# Patient Record
Sex: Male | Born: 1945 | Race: White | Hispanic: No | Marital: Married | Smoking: Never smoker
Health system: Southern US, Community
[De-identification: ages and names within clinical notes are randomized; demographics above are authoritative.]

## PROBLEM LIST (undated history)

## (undated) DIAGNOSIS — Z952 Presence of prosthetic heart valve: Secondary | ICD-10-CM

## (undated) DIAGNOSIS — E785 Hyperlipidemia, unspecified: Secondary | ICD-10-CM

## (undated) DIAGNOSIS — I739 Peripheral vascular disease, unspecified: Secondary | ICD-10-CM

## (undated) DIAGNOSIS — I10 Essential (primary) hypertension: Secondary | ICD-10-CM

## (undated) DIAGNOSIS — L989 Disorder of the skin and subcutaneous tissue, unspecified: Secondary | ICD-10-CM

## (undated) DIAGNOSIS — C4490 Unspecified malignant neoplasm of skin, unspecified: Secondary | ICD-10-CM

## (undated) DIAGNOSIS — I712 Thoracic aortic aneurysm, without rupture, unspecified: Secondary | ICD-10-CM

## (undated) DIAGNOSIS — I350 Nonrheumatic aortic (valve) stenosis: Secondary | ICD-10-CM

## (undated) DIAGNOSIS — I359 Nonrheumatic aortic valve disorder, unspecified: Secondary | ICD-10-CM

## (undated) DIAGNOSIS — R0789 Other chest pain: Secondary | ICD-10-CM

## (undated) DIAGNOSIS — I77810 Thoracic aortic ectasia: Secondary | ICD-10-CM

## (undated) HISTORY — DX: Essential (primary) hypertension: I10

## (undated) HISTORY — DX: Thoracic aortic aneurysm, without rupture: I71.2

## (undated) HISTORY — DX: Disorder of the skin and subcutaneous tissue, unspecified: L98.9

## (undated) HISTORY — DX: Unspecified malignant neoplasm of skin, unspecified: C44.90

## (undated) HISTORY — DX: Hyperlipidemia, unspecified: E78.5

## (undated) HISTORY — DX: Nonrheumatic aortic valve disorder, unspecified: I35.9

## (undated) HISTORY — DX: Peripheral vascular disease, unspecified: I73.9

## (undated) HISTORY — DX: Other chest pain: R07.89

## (undated) HISTORY — DX: Thoracic aortic aneurysm, without rupture, unspecified: I71.20

---

## 2001-02-18 HISTORY — PX: MOUTH SURGERY: SHX715

## 2002-03-23 ENCOUNTER — Encounter: Payer: Self-pay | Admitting: Sports Medicine

## 2002-03-23 ENCOUNTER — Encounter: Admission: RE | Admit: 2002-03-23 | Discharge: 2002-03-23 | Payer: Self-pay | Admitting: Sports Medicine

## 2002-04-22 ENCOUNTER — Ambulatory Visit (HOSPITAL_BASED_OUTPATIENT_CLINIC_OR_DEPARTMENT_OTHER): Admission: RE | Admit: 2002-04-22 | Discharge: 2002-04-22 | Payer: Self-pay | Admitting: Orthopedic Surgery

## 2003-02-19 HISTORY — PX: TENDON REPAIR: SHX5111

## 2003-09-28 ENCOUNTER — Encounter: Admission: RE | Admit: 2003-09-28 | Discharge: 2003-09-28 | Payer: Self-pay | Admitting: Family Medicine

## 2005-12-05 HISTORY — PX: CARDIAC CATHETERIZATION: SHX172

## 2005-12-12 ENCOUNTER — Ambulatory Visit (HOSPITAL_COMMUNITY): Admission: RE | Admit: 2005-12-12 | Discharge: 2005-12-12 | Payer: Self-pay | Admitting: Cardiovascular Disease

## 2006-01-02 ENCOUNTER — Encounter: Admission: RE | Admit: 2006-01-02 | Discharge: 2006-01-02 | Payer: Self-pay | Admitting: Orthopedic Surgery

## 2006-11-05 ENCOUNTER — Ambulatory Visit (HOSPITAL_COMMUNITY): Admission: RE | Admit: 2006-11-05 | Discharge: 2006-11-05 | Payer: Self-pay | Admitting: Cardiovascular Disease

## 2008-01-01 ENCOUNTER — Ambulatory Visit (HOSPITAL_COMMUNITY): Admission: RE | Admit: 2008-01-01 | Discharge: 2008-01-01 | Payer: Self-pay | Admitting: Cardiovascular Disease

## 2008-12-26 ENCOUNTER — Ambulatory Visit (HOSPITAL_COMMUNITY): Admission: RE | Admit: 2008-12-26 | Discharge: 2008-12-26 | Payer: Self-pay | Admitting: Cardiovascular Disease

## 2010-07-06 NOTE — Op Note (Signed)
NAME:  Sean Abbott, Sean Abbott NO.:  [id number]   MEDICAL RECORD NO.:  [id number]                   PATIENT TYPE:  AMB   LOCATION:  DSC                                  FACILITY:  [facility]   PHYSICIAN:  Loreta Ave, M.D.              DATE OF BIRTH:  May 24, 1945   DATE OF PROCEDURE:  04/22/2002  DATE OF DISCHARGE:                                 OPERATIVE REPORT   PREOPERATIVE DIAGNOSIS:  Chronic lateral epicondylitis, left elbow, with  tearing extensor carpi radialis brevis tendon from epicondyle.   POSTOPERATIVE DIAGNOSIS:  Chronic lateral epicondylitis, left elbow, with  tearing extensor carpi radialis brevis tendon from epicondyle.   OPERATIVE PROCEDURE:  Left elbow exploration with debridement of extensor  carpi radialis brevis tendon and removal of inflammatory debris including  arthrotomy and partial synovectomy.  Drilling epicondyle.  Repair extensor  carpi radialis brevis to superficial extensors.   SURGEON:  Loreta Ave, M.D.   ASSISTANT:  Arlys John D. Petrarca, P.A.-C.   ANESTHESIA:  General.   BLOOD LOSS:  Minimal.   SPECIMENS:  None.   CULTURES:  None.   DRESSINGS:  Soft, compressive with a sugar-tong splint.   TOURNIQUET TIME:  40 minutes.   PROCEDURE IN DETAIL:  The patient was brought to the operating room and  placed on the operating table in the supine position.  After adequate  anesthesia had been obtained, the left elbow was examined.  There was full  motion and good stability.  Tourniquet applied.  Prepped and draped in the  usual sterile fashion.  Exsanguinated with elevation and Esmarch.  Tourniquet inflated to 250 mmHg.  Longitudinal incision from the lateral  epicondyle extending distally.  Skin and subcutaneous tissue divided.  Superficial extensors intact and had good continuity up to the epicondyle.  These were divided longitudinally, exposing the ECRB tendon.  Marked  mucinous degeneration and tearing off the  epicondyle with degeneration of  the tendon all the way down to the radial head.  All inflammatory debris,  lateral elbow capsule which was adherent, torn and inflamed, as well as  underlying synovial tissue debrided.  Thorough irrigation.  Care taken to  remove all inflammatory and degenerative debris.  I still had good  longitudinal continuity of the superficial extensors.  Epicondyle debrided,  treated with multiple drilling, and sharp bony spurs removed.  Wound  irrigated.  The superficial extensors were then reapproximated over the  epicondyle, incorporating the ECRB tendon into this with a longitudinal  closure with Vicryl.  Wound irrigated.  Skin  and subcutaneous tissue closed with Vicryl.  Margins were injected with  Marcaine.  Steri-Strips applied.  Sterile compressive dressing and sugar-  tong splint applied.  Tourniquet deflated and removed.  Sling applied.  Anesthesia burst.  Brought to recovery room.  Tolerated procedure without  any complications.  Loreta Ave, M.D.    DFM/MEDQ  D:  04/22/2002  T:  04/23/2002  Job:  [id number]

## 2010-07-20 ENCOUNTER — Other Ambulatory Visit: Payer: Self-pay | Admitting: Cardiovascular Disease

## 2010-07-20 DIAGNOSIS — I712 Thoracic aortic aneurysm, without rupture: Secondary | ICD-10-CM

## 2010-07-25 ENCOUNTER — Ambulatory Visit
Admission: RE | Admit: 2010-07-25 | Discharge: 2010-07-25 | Disposition: A | Discharge status: Home / Self Care | Payer: BC Managed Care – PPO | Source: Ambulatory Visit | Attending: Cardiovascular Disease | Admitting: Cardiovascular Disease

## 2010-07-25 DIAGNOSIS — I712 Thoracic aortic aneurysm, without rupture: Secondary | ICD-10-CM

## 2010-07-25 MED ORDER — IOHEXOL 300 MG/ML  SOLN
100.0000 mL | Freq: Once | INTRAMUSCULAR | Status: AC | PRN
  Administered 2010-07-25: 100 mL via INTRAVENOUS

## 2011-10-31 ENCOUNTER — Other Ambulatory Visit: Payer: Self-pay | Admitting: Cardiovascular Disease

## 2011-10-31 DIAGNOSIS — I712 Thoracic aortic aneurysm, without rupture: Secondary | ICD-10-CM

## 2011-11-22 ENCOUNTER — Ambulatory Visit
Admission: RE | Admit: 2011-11-22 | Discharge: 2011-11-22 | Disposition: A | Discharge status: Home / Self Care | Payer: Medicare Other | Source: Ambulatory Visit | Attending: Cardiovascular Disease | Admitting: Cardiovascular Disease

## 2011-11-22 DIAGNOSIS — I712 Thoracic aortic aneurysm, without rupture: Secondary | ICD-10-CM

## 2011-11-22 MED ORDER — IOHEXOL 350 MG/ML SOLN
100.0000 mL | Freq: Once | INTRAVENOUS | Status: AC | PRN
  Administered 2011-11-22: 100 mL via INTRAVENOUS

## 2012-08-24 ENCOUNTER — Encounter: Payer: Self-pay | Admitting: Cardiovascular Disease

## 2012-08-26 ENCOUNTER — Ambulatory Visit: Payer: Medicare Other | Admitting: Cardiovascular Disease

## 2012-09-10 ENCOUNTER — Encounter: Payer: Self-pay | Admitting: Cardiovascular Disease

## 2012-09-10 ENCOUNTER — Ambulatory Visit (INDEPENDENT_AMBULATORY_CARE_PROVIDER_SITE_OTHER): Payer: Medicare Other | Admitting: Cardiovascular Disease

## 2012-09-10 VITALS — BP 120/84 | HR 63 | Ht 70.0 in | Wt 189.0 lb

## 2012-09-10 DIAGNOSIS — I359 Nonrheumatic aortic valve disorder, unspecified: Secondary | ICD-10-CM

## 2012-09-10 DIAGNOSIS — I712 Thoracic aortic aneurysm, without rupture, unspecified: Secondary | ICD-10-CM | POA: Insufficient documentation

## 2012-09-10 DIAGNOSIS — R0989 Other specified symptoms and signs involving the circulatory and respiratory systems: Secondary | ICD-10-CM

## 2012-09-10 DIAGNOSIS — IMO0001 Reserved for inherently not codable concepts without codable children: Secondary | ICD-10-CM

## 2012-09-10 DIAGNOSIS — E785 Hyperlipidemia, unspecified: Secondary | ICD-10-CM | POA: Insufficient documentation

## 2012-09-10 DIAGNOSIS — Z79899 Other long term (current) drug therapy: Secondary | ICD-10-CM

## 2012-09-10 DIAGNOSIS — E782 Mixed hyperlipidemia: Secondary | ICD-10-CM

## 2012-09-10 DIAGNOSIS — I350 Nonrheumatic aortic (valve) stenosis: Secondary | ICD-10-CM

## 2012-09-10 NOTE — Assessment & Plan Note (Signed)
Last echo 12/05/11 revealing mild concentric LVH, normal LV function at a valve area of 1.1 cm, peak gradient of 31 and mean of 19. We'll recheck this October

## 2012-09-10 NOTE — Patient Instructions (Addendum)
Your physician has requested that you have an echocardiogram in October 2014. Echocardiography is a painless test that uses sound waves to create images of your heart. It provides your doctor with information about the size and shape of your heart and how well your heart's chambers and valves are working. This procedure takes approximately one hour. There are no restrictions for this procedure.  Non-Cardiac CT Angiography (CTA), is a special type of CT scan that uses a computer to produce multi-dimensional views of major blood vessels throughout the body. In CT angiography, a contrast material is injected through an IV to help visualize the blood vessels. Our scheduling department will call you with the appointment.  Your physician has requested that you have a carotid duplex. This test is an ultrasound of the carotid arteries in your neck. It looks at blood flow through these arteries that supply the brain with blood. Allow one hour for this exam. There are no restrictions or special instructions.  Your physician recommends that you return for lab work in: 3 months.    Your physician recommends that you schedule a follow-up appointment in: One Year

## 2012-09-10 NOTE — Assessment & Plan Note (Signed)
Last measured by CT angiography October 2013-5.1 cm. We will repeat check this coming October

## 2012-09-10 NOTE — Progress Notes (Signed)
09/10/2012 Sean Abbott   [birth year]  [id number]  Primary Physician No primary provider on file. Primary Cardiologist: Runell Gess MD Sean Abbott   HPI:  The patient is a delightful 67 year old, thin and fit-appearing, married Caucasian male, father of 2, grandfather to 3 grandchildren who I saw a year ago. He is accompanied by his wife today. He has a history of normal coronary arteries by cath which I performed December 05, 2005. He did have a generous thoracic aorta at that time and a CT showed his thoracic aorta measuring 4 cm as well as mild to moderate aortic stenosis. His other problems include mild hyperlipidemia in Crestor. He denies chest pain or shortness of breath but does complain of just some generalized fatigue, though he does exercise several times a week. 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 His last echo performed November 23, 2010, revealed normal LV systolic function, mild concentric LVH with a valve area of 1.1 cm squared, peak gradient of 29 and mean of 17. Since I saw him last 08/01/11 he's been completely asymptomatic.      Current Outpatient Prescriptions  Medication Sig Dispense Refill  . aspirin EC 81 MG tablet Take 81 mg by mouth daily.      . Glucosamine-Chondroit-Vit C-Mn (GLUCOSAMINE-CHONDROITIN) CAPS Take 1 capsule by mouth daily.      [name] KRILL OIL PO Take 1 capsule by mouth daily.      . Multiple Vitamin (MULTIVITAMIN) tablet Take 1 tablet by mouth daily.      . rosuvastatin (CRESTOR) 10 MG tablet Take 10 mg by mouth every other day.        No current facility-administered medications for this visit.    Allergies  Allergen Reactions  . Demerol (Meperidine)   . Lipitor (Atorvastatin) Other (See Comments)    Myalgias    History   Social History  . Marital Status: Married    Spouse Name: N/A     Number of Children: N/A  . Years of Education: N/A   Occupational History  . Not on file.   Social History Main Topics  . Smoking status: Never Smoker   . Smokeless tobacco: Not on file  . Alcohol Use: Not on file  . Drug Use: Not on file  . Sexually Active: Not on file   Other Topics Concern  . Not on file   Social History Narrative  . No narrative on file     Review of Systems: General: negative for chills, fever, night sweats or weight changes.  Cardiovascular: negative for chest pain, dyspnea on exertion, edema, orthopnea, palpitations, paroxysmal nocturnal dyspnea or shortness of breath Dermatological: negative for rash Respiratory: negative for cough or wheezing Urologic: negative for hematuria Abdominal: negative for nausea, vomiting, diarrhea, bright red blood per rectum, melena, or hematemesis Neurologic: negative for visual changes, syncope, or dizziness All other systems reviewed and are otherwise negative except as noted above.    Blood pressure 120/84, pulse 63, height 5\' 10"  (1.778 m), weight 189 lb (85.73 kg).  General appearance: alert and no distress Neck: no adenopathy, no JVD, supple, symmetrical, trachea midline, thyroid not enlarged, symmetric, no tenderness/mass/nodules and left carotid bruit Lungs: clear to auscultation bilaterally Heart: 2/6 outflow tract murmur consistent with aortic stenosis Extremities: extremities normal, atraumatic, no cyanosis or edema  EKG normal sinus rhythm at 63 without ST  or T wave changes  ASSESSMENT AND PLAN:   Thoracic aortic aneurysm Last measured by CT angiography October 2013-5.1 cm. We will repeat check this coming October  Aortic stenosis Last echo 12/05/11 revealing mild concentric LVH, normal LV function at a valve area of 1.1 cm, peak gradient of 31 and mean of 19. We'll recheck this October      Runell Gess MD [facility] 09/10/2012 5:30 PM

## 2012-09-16 ENCOUNTER — Other Ambulatory Visit (HOSPITAL_COMMUNITY): Payer: Medicare Other

## 2012-10-28 ENCOUNTER — Other Ambulatory Visit: Payer: Self-pay | Admitting: *Deleted

## 2012-10-28 DIAGNOSIS — Z79899 Other long term (current) drug therapy: Secondary | ICD-10-CM

## 2012-10-28 DIAGNOSIS — I712 Thoracic aortic aneurysm, without rupture: Secondary | ICD-10-CM

## 2012-11-03 ENCOUNTER — Other Ambulatory Visit: Payer: Self-pay | Admitting: *Deleted

## 2012-11-03 DIAGNOSIS — Z79899 Other long term (current) drug therapy: Secondary | ICD-10-CM

## 2012-11-23 ENCOUNTER — Ambulatory Visit (HOSPITAL_COMMUNITY)
Admission: RE | Admit: 2012-11-23 | Discharge: 2012-11-23 | Disposition: A | Discharge status: Home / Self Care | Payer: Medicare Other | Source: Ambulatory Visit | Attending: Cardiovascular Disease | Admitting: Cardiovascular Disease

## 2012-11-23 DIAGNOSIS — I350 Nonrheumatic aortic (valve) stenosis: Secondary | ICD-10-CM

## 2012-11-23 DIAGNOSIS — I712 Thoracic aortic aneurysm, without rupture, unspecified: Secondary | ICD-10-CM | POA: Insufficient documentation

## 2012-11-23 DIAGNOSIS — I359 Nonrheumatic aortic valve disorder, unspecified: Secondary | ICD-10-CM

## 2012-11-23 NOTE — Progress Notes (Signed)
2D Echo Performed 11/23/2012    Phiona Ramnauth, RCS  

## 2012-11-24 ENCOUNTER — Encounter (HOSPITAL_COMMUNITY): Payer: Self-pay

## 2012-11-24 ENCOUNTER — Ambulatory Visit (HOSPITAL_COMMUNITY)
Admission: RE | Admit: 2012-11-24 | Discharge: 2012-11-24 | Disposition: A | Discharge status: Home / Self Care | Payer: Medicare Other | Source: Ambulatory Visit | Attending: Cardiovascular Disease | Admitting: Cardiovascular Disease

## 2012-11-24 DIAGNOSIS — I712 Thoracic aortic aneurysm, without rupture: Secondary | ICD-10-CM

## 2012-11-24 MED ORDER — IOHEXOL 350 MG/ML SOLN
80.0000 mL | Freq: Once | INTRAVENOUS | Status: AC | PRN
  Administered 2012-11-24: 80 mL via INTRAVENOUS

## 2012-11-25 ENCOUNTER — Ambulatory Visit (HOSPITAL_COMMUNITY)
Admission: RE | Admit: 2012-11-25 | Discharge: 2012-11-25 | Disposition: A | Discharge status: Home / Self Care | Payer: Medicare Other | Source: Ambulatory Visit | Attending: Cardiovascular Disease | Admitting: Cardiovascular Disease

## 2012-11-25 DIAGNOSIS — R0989 Other specified symptoms and signs involving the circulatory and respiratory systems: Secondary | ICD-10-CM

## 2012-11-25 LAB — BASIC METABOLIC PANEL
Calcium: 9.1 mg/dL (ref 8.4–10.5)
Creat: 1.06 mg/dL (ref 0.50–1.35)

## 2012-11-25 NOTE — Progress Notes (Signed)
Carotid Duplex Completed. Sean Abbott, BS, RDMS, RVT  

## 2012-11-26 ENCOUNTER — Encounter: Payer: Self-pay | Admitting: *Deleted

## 2012-11-26 NOTE — Progress Notes (Signed)
Quick Note:  Note sent to patient ______ 

## 2012-11-27 NOTE — Progress Notes (Signed)
Quick Note:  Note sent to patient ______ 

## 2013-05-10 ENCOUNTER — Telehealth: Payer: Self-pay | Admitting: Cardiovascular Disease

## 2013-05-10 ENCOUNTER — Encounter (INDEPENDENT_AMBULATORY_CARE_PROVIDER_SITE_OTHER): Payer: Self-pay

## 2013-05-10 NOTE — Telephone Encounter (Signed)
Returned call and pt verified x 2.  Pt stated he had some tests run last fall and remembers getting some information about logging in to check them, but lost the papers.  Would like the information again and for it to be mailed to his [place] address.  RN also had pt verify the last 4 digits of his SS number.  Pt informed will mail information.  Pt verbalized understanding and agreed w/ plan.  Address:  [address] [place] [address]  [place] letter printed and mailed.

## 2013-05-10 NOTE — Telephone Encounter (Signed)
Please call,wants to know how to use his my-chart please.

## 2013-05-11 ENCOUNTER — Telehealth: Payer: Self-pay | Admitting: Cardiovascular Disease

## 2013-05-11 MED ORDER — ROSUVASTATIN CALCIUM 10 MG PO TABS: 10.0000 mg | ORAL_TABLET | ORAL | Status: DC

## 2013-05-11 NOTE — Telephone Encounter (Signed)
Refills sent to pharmacy. 

## 2013-05-11 NOTE — Telephone Encounter (Signed)
Need refill on Crestor 10 mg #90

## 2013-05-12 ENCOUNTER — Other Ambulatory Visit: Payer: Self-pay | Admitting: *Deleted

## 2013-05-14 ENCOUNTER — Other Ambulatory Visit: Payer: Self-pay | Admitting: *Deleted

## 2013-10-12 ENCOUNTER — Telehealth: Payer: Self-pay | Admitting: Cardiovascular Disease

## 2013-10-12 DIAGNOSIS — I350 Nonrheumatic aortic (valve) stenosis: Secondary | ICD-10-CM

## 2013-10-12 NOTE — Telephone Encounter (Signed)
Mr. Alcorta is wanting to know should he have any test done before scheduling an appt with Dr. Gwenlyn Found. He is suppose to come back in year . Last office visit was 08/2012.

## 2013-10-12 NOTE — Telephone Encounter (Signed)
He needs an echo.  I have placed the order

## 2013-10-19 ENCOUNTER — Ambulatory Visit (HOSPITAL_COMMUNITY)
Admission: RE | Admit: 2013-10-19 | Discharge: 2013-10-19 | Disposition: A | Discharge status: Home / Self Care | Payer: Medicare Other | Source: Ambulatory Visit | Attending: Cardiology | Admitting: Cardiology

## 2013-10-19 DIAGNOSIS — I712 Thoracic aortic aneurysm, without rupture, unspecified: Secondary | ICD-10-CM | POA: Diagnosis not present

## 2013-10-19 DIAGNOSIS — I079 Rheumatic tricuspid valve disease, unspecified: Secondary | ICD-10-CM | POA: Insufficient documentation

## 2013-10-19 DIAGNOSIS — I350 Nonrheumatic aortic (valve) stenosis: Secondary | ICD-10-CM

## 2013-10-19 DIAGNOSIS — I359 Nonrheumatic aortic valve disorder, unspecified: Secondary | ICD-10-CM | POA: Diagnosis not present

## 2013-10-19 DIAGNOSIS — E785 Hyperlipidemia, unspecified: Secondary | ICD-10-CM | POA: Insufficient documentation

## 2013-10-19 NOTE — Progress Notes (Signed)
2D Echocardiogram Complete.  10/19/2013   Sean Abbott, [place]

## 2013-10-21 ENCOUNTER — Telehealth: Payer: Self-pay | Admitting: *Deleted

## 2013-10-21 DIAGNOSIS — I350 Nonrheumatic aortic (valve) stenosis: Secondary | ICD-10-CM

## 2013-10-21 NOTE — Telephone Encounter (Signed)
Message copied by Chauncy Lean on Thu Oct 21, 2013  2:59 PM ------      Message from: Lorretta Harp      Created: Tue Oct 19, 2013  7:17 PM       Nl LV fxn. Mod AS. No change. Repeat 12 months ------

## 2013-10-21 NOTE — Telephone Encounter (Signed)
Order placed for repeat echocardiogram in 1 year 

## 2014-01-14 ENCOUNTER — Other Ambulatory Visit: Payer: Self-pay | Admitting: Cardiovascular Disease

## 2018-06-05 ENCOUNTER — Ambulatory Visit: Payer: Medicare Other | Admitting: Cardiovascular Disease

## 2018-07-06 ENCOUNTER — Telehealth: Payer: Self-pay | Admitting: Cardiovascular Disease

## 2018-07-07 ENCOUNTER — Other Ambulatory Visit: Payer: Self-pay

## 2018-07-07 ENCOUNTER — Telehealth (INDEPENDENT_AMBULATORY_CARE_PROVIDER_SITE_OTHER): Payer: Medicare Other | Admitting: Cardiovascular Disease

## 2018-07-07 ENCOUNTER — Telehealth: Payer: Self-pay

## 2018-07-07 VITALS — BP 116/76 | HR 64 | Ht 70.0 in | Wt 187.0 lb

## 2018-07-07 DIAGNOSIS — Z01812 Encounter for preprocedural laboratory examination: Secondary | ICD-10-CM

## 2018-07-07 DIAGNOSIS — I712 Thoracic aortic aneurysm, without rupture, unspecified: Secondary | ICD-10-CM

## 2018-07-07 DIAGNOSIS — I350 Nonrheumatic aortic (valve) stenosis: Secondary | ICD-10-CM | POA: Diagnosis not present

## 2018-07-07 DIAGNOSIS — E782 Mixed hyperlipidemia: Secondary | ICD-10-CM

## 2018-07-07 NOTE — Patient Instructions (Signed)
    [place] [address] Dept: [phone] Loc: [place]  07/07/2018  You are scheduled for a Cardiac Catheterization on Monday, June 1 with Dr. Quay Burow.  1. Please arrive at the [facility] (Main Entrance A) at [facility]: [address] [place], [place] [address] at 5:30 AM (This time is two hours before your procedure to ensure your preparation). Free valet parking service is available.   Special note: Every effort is made to have your procedure done on time. Please understand that emergencies sometimes delay scheduled procedures.  2. Diet: Do not eat solid foods after midnight.  The patient may have clear liquids until 5am upon the day of the procedure.  3. Labs: You will need to have blood drawn  3-7 DAYS PRIOR TO YOUR CARDIAC CATHETERIZATION (CBC, BMET). You will also need to have a COVID-19 Test performed 3 DAYS PRIOR TO [place].  Go to:  Retail buyer at [employer] [id number], [place], [place] [address] FOR YOUR BMET and CBC LAB WORK. NO APPOINTMENT IS NEEDED. YOU MUST HAVE THIS LAB WORK DONE BEFORE GOING TO [place] COVID-19 TEST BECAUSE YOU WILL NEED TO QUARANTINE YOURSELF AFTER THE COVID-19 TEST UNTIL YOU RECEIVE YOUR RESULTS (NEGATIVE RESULTS).  Go to: [facility]  [address] [place], [place] [address] FOR YOUR COVID-19 TEST. YOU WILL NEED AN APPOINTMENT FOR THIS TEST. YOU WILL ALSO NEED TO QUARANTINE YOURSELF AFTER THE COVID-19 TEST UNTIL YOU RECEIVE YOUR RESULTS (NEGATIVE RESULTS). YOU SHOULD RECEIVE YOUR RESULTS IN 48 HOURS OR LESS.   4. Medication instructions in preparation for your procedure:  On the morning of your procedure, take your Aspirin 81 MG and any morning medicines NOT listed above.  You may use sips of  water.  5. Plan for one night stay--bring personal belongings. 6. Bring a current list of your medications and current insurance cards. 7. You MUST have a responsible person to drive you home. 8. Someone MUST be with you the first 24 hours after you arrive home or your discharge will be delayed. 9. Please wear clothes that are easy to get on and off and wear slip-on shoes.   Testing/Procedures: Your physician has requested that you have a Non-Cardiac CT Angiography (CTA), is a special type of CT scan that uses a computer to produce multi-dimensional views of major blood vessels throughout the body. In CT angiography, a contrast material is injected through an IV to help visualize the blood vessels. Chest/Thoracic CT  Follow-Up: At [facility], you and your health needs are our priority.  As part of our continuing mission to provide you with exceptional heart care, we have created designated Provider Care Teams.  These Care Teams include your primary Cardiologist (physician) and Advanced Practice Providers (APPs -  Physician Assistants and Nurse Practitioners) who all work together to provide you with the care you need, when you need it. You will need a virtual follow up appointment in 6 weeks with Dr. Gwenlyn Found. You will be contacted by a scheduler to set up this appointment.   Any Other Special Instructions Will Be Listed Below (If Applicable). You will need an appointment with Dr. Sherren Mocha to discuss TAVR and may potentially be scheduled for a TAVR procedure after your cardiac catheterization.

## 2018-07-07 NOTE — Addendum Note (Signed)
Addended by: Annita Brod on: 07/07/2018 03:59 PM   Modules accepted: Orders, SmartSet

## 2018-07-07 NOTE — Progress Notes (Signed)
Virtual Visit via Video Note   This visit type was conducted due to national recommendations for restrictions regarding the COVID-19 Pandemic (e.g. social distancing) in an effort to limit this patient's exposure and mitigate transmission in our community.  Due to his co-morbid illnesses, this patient is at least at moderate risk for complications without adequate follow up.  This format is felt to be most appropriate for this patient at this time.  All issues noted in this document were discussed and addressed.  A limited physical exam was performed with this format.  Please refer to the patient's chart for his consent to telehealth for [facility].   Date:  07/07/2018   ID:  Sean Abbott, DOB 07-13-1945, MRN [id number]  Patient Location: Home Provider Location: Home  PCP:  Kathyrn Lass, MD  Cardiologist: Dr. Quay Burow Electrophysiologist:  None   Evaluation Performed:  New Patient Evaluation  Chief Complaint: Severe aortic stenosis  History of Present Illness:    patient is a delightful 73 -year-old, thin and fit-appearing, married Caucasian male, father of 2, grandfather to 3 grandchildren who I last saw in the office 09/10/2012.  He is accompanied by his wife today. He has a history of normal coronary arteries by cath which I performed December 05, 2005. He did have a generous thoracic aorta at that time and a CT showed his thoracic aorta measuring 4 cm as well as mild to moderate aortic stenosis. His other problems include mild hyperlipidemia in Crestor. He denies chest pain or shortness of breath but does complain of just some generalized fatigue, though he does exercise several times a week. 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  He had an echo performed 10/19/2013 revealing normal LV function, valve area 1.2 cm with a peak aortic  gradient of 54 mmHg.  He moved down to [facility] to be closer to his mom who had Alzheimer's.  She apparently passed away this past 04/08/2022.  He is currently retired.  He plays golf 4 days a week.  He notices that on the back 9 he has increasing dyspnea on exertion over the last couple years which is more noticeable.  He did see a cardiologist down there and had an echo performed 02/26/2018 revealing normal LV function with a peak gradient of 84 mmHg and a valve area 0.82 cm.  He clearly is to the point where he needs valve replacement.  He may be a candidate for TAVR.   The patient does not have symptoms concerning for COVID-19 infection (fever, chills, cough, or new shortness of breath).    Past Medical History:  Diagnosis Date  . Aortic valve disorder    2D ECHO, 12/05/2011 - EF >55%, moderate calcification of the aortic valve leaflets, moderate valvular aortic stenosis  . Chest pain, atypical    NUCLEAR STRESS TEST, 11/14/2005 - ECG positive for ischemia  . Claudication    LEA DUPLEX SCAN, 01/23/2009 - normal, no evidence of arterial insufficiency  . Hyperlipidemia   . Hypertension   . Skin cancer   . Skin disorder   . Thoracic aortic aneurysm    Past Surgical History:  Procedure Laterality Date  . CARDIAC CATHETERIZATION  12/05/2005   Normal coronary arteries, rule out thoracic aortic aneurysm  . MOUTH SURGERY  2003  . TENDON REPAIR Left 2005   Elbow  No outpatient medications have been marked as taking for the 07/07/18 encounter (Appointment) with Lorretta Harp, MD.     Allergies:   Demerol [meperidine] and Lipitor [atorvastatin]   Social History   Tobacco Use  . Smoking status: Never Smoker  Substance Use Topics  . Alcohol use: Not on file  . Drug use: Not on file     Family Hx: The patient's family history includes Diabetes in his father; Heart disease in his father; Hypertension in his father.  ROS:   Please see the history of present illness.      All other systems reviewed and are negative.   Prior CV studies:   The following studies were reviewed today:  2D echocardiogram performed 02/26/2018  Labs/Other Tests and Data Reviewed:    EKG:  No ECG reviewed.  Recent Labs: No results found for requested labs within last 8760 hours.   Recent Lipid Panel No results found for: CHOL, TRIG, HDL, CHOLHDL, LDLCALC, LDLDIRECT  Wt Readings from Last 3 Encounters:  09/10/12 189 lb (85.7 kg)     Objective:    Vital Signs:  There were no vitals taken for this visit.   VITAL SIGNS:  reviewed GEN:  no acute distress RESPIRATORY:  normal respiratory effort, symmetric expansion NEURO:  alert and oriented x 3, no obvious focal deficit PSYCH:  normal affect  ASSESSMENT & PLAN:    1. Severe aortic stenosis- [name] has progressed from moderate to severe left ear over the last 5 to 6 years.  His echo performed 02/26/2018 by his cardiologist in [facility] revealed normal LV systolic function with a peak gradient of 84 mmHg increased from 54 mmHg 5 years ago and a valve area 0.8 cm.  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  He wishes to explore TAVR which he may be a good candidate for.  I cathed him last 12/05/2005 revealing normal coronary arteries.  I am going to arrange for him to come up to [facility] for right left heart cath radial/brachial and then evaluation by Dr. Burt Knack for consideration of TAVR. 2. Thoracic aortic aneurysm- measuring 42 mm in the past.  We will recheck a thoracic CTA to further evaluate 3. Hyperlipidemia- history of hyperlipidemia on Crestor 10 mg every other day followed by his PCP  COVID-19 Education: The signs and symptoms of COVID-19 were discussed with the patient and how to seek care for testing (follow up with PCP or arrange E-visit).  The importance of social distancing was discussed today.  Time:   Today, I have spent 14 minutes with the  patient with telehealth technology discussing the above problems.     Medication Adjustments/Labs and Tests Ordered: Current medicines are reviewed at length with the patient today.  Concerns regarding medicines are outlined above.   Tests Ordered: No orders of the defined types were placed in this encounter.   Medication Changes: No orders of the defined types were placed in this encounter.   Disposition:  Follow up We will arrange outpatient right left heart cath in [place] sometime the next 4 weeks  Signed, Quay Burow, MD  07/07/2018 7:46 AM    [place]

## 2018-07-07 NOTE — Telephone Encounter (Signed)
Patient and/or DPR-approved person aware of AVS instructions and verbalized understanding. AVS released to [employer]

## 2018-07-08 ENCOUNTER — Other Ambulatory Visit: Payer: Self-pay

## 2018-07-08 DIAGNOSIS — I712 Thoracic aortic aneurysm, without rupture, unspecified: Secondary | ICD-10-CM

## 2018-07-08 DIAGNOSIS — I350 Nonrheumatic aortic (valve) stenosis: Secondary | ICD-10-CM

## 2018-07-09 ENCOUNTER — Telehealth: Payer: Self-pay

## 2018-07-09 ENCOUNTER — Telehealth: Payer: Self-pay | Admitting: Cardiovascular Disease

## 2018-07-09 NOTE — Telephone Encounter (Signed)
New Message     Pt is returning call to [place]   Please call back

## 2018-07-09 NOTE — Telephone Encounter (Signed)
Ewurum, Sochima O, RN    Note    Called pt to update him on the following but unable to reach him(lmtcb):  1. He has a virtual appt on 5/27 at 11:00am with Dr. Burt Knack to discuss TAVR. This, of course, can be attended from home in [place]  2. On 5/28, he should present to HeartCare at [facility] for STAT labs (CBC and BMP)  3. On 5/29 he will have TAVR scans in the AM (time of scans pending update from Theodosia Quay, Dr. Antionette Char nurse navigator). He will be contacted with an update on the time.  4. On 5/29 in the PM he should present at [facility] for rapid COVID-19 test (his appt time for this needs to be rescheduled to sometime in the afternoon; possibly 2:00pm)      spoke to patient - the above  information given - patient states she sees the I dates in [place].  He verbalized understanding.  Patient is aware Chima will talk with him about the appointment for covid -19 test on 5/29.  Patient states he sees an appointment at 9:05 am that day.  RN informed patient that Ander Purpura would give him more details about scans for 5/29 - after appointment with Dr Burt Knack.

## 2018-07-09 NOTE — Telephone Encounter (Signed)
Called pt to update him on the following but unable to reach him(lmtcb):  1. He has a virtual appt on 5/27 at 11:00am with Dr. Burt Knack to discuss TAVR. This, of course, can be attended from home in [place]  2. On 5/28, he should present to HeartCare at [facility] for STAT labs (CBC and BMP)  3. On 5/29 he will have TAVR scans in the AM (time of scans pending update from Theodosia Quay, Dr. Antionette Char nurse navigator). He will be contacted with an update on the time.  4. On 5/29 in the PM he should present at [facility] for rapid COVID-19 test (his appt time for this needs to be rescheduled to sometime in the afternoon; possibly 2:00pm)

## 2018-07-10 ENCOUNTER — Telehealth: Payer: Self-pay

## 2018-07-10 NOTE — Telephone Encounter (Signed)
Spoke with pt and reviewed TAVR instructions sent to him via mychart by Theodosia Quay, RN. Also discussed the following:  1. He has a virtualappt on 5/27 at 11:00am with Dr. Burt Knack to discuss TAVR.   2. On 5/28, he should present to HeartCare at [facility] for STAT labs (CBC and BMP)  3. On 5/29 he will have TAVR scansstarting 10:30am   4. On 5/29 at 12:30 he should present to [facility] for rapid COVID-19 test and then quarantine until he receives results  5. On 6/1 he will have cardiac cath. Pt already aware of instructions  6. Dr. Burt Knack will communicate with pt if he is candidate for TAVR and this will be set up by his office  Nurse requested pt obtain a copy of his January Echocardiogram on CD and bring this to the hospital on the day of cardiac catheterization and also give this to Dr Gwenlyn Found office so that it can be loaded to the [facility] Echo system for the TAVR team to review per TAVR instructions. Pt states he sent this to office. Will forward to med records to see if echo CD available

## 2018-07-14 ENCOUNTER — Telehealth: Payer: Self-pay

## 2018-07-14 NOTE — Telephone Encounter (Signed)
Spoke with pt and obtained verbal consent for video visit. Pt will have vitals ready for appt.     YOUR CARDIOLOGY TEAM HAS ARRANGED FOR AN E-VISIT FOR YOUR APPOINTMENT - PLEASE REVIEW IMPORTANT INFORMATION BELOW SEVERAL DAYS PRIOR TO YOUR APPOINTMENT  Due to the recent COVID-19 pandemic, we are transitioning in-person office visits to tele-medicine visits in an effort to decrease unnecessary exposure to our patients, their families, and staff. These visits are billed to your insurance just like a normal visit is. We also encourage you to sign up for MyChart if you have not already done so. You will need a smartphone if possible. For patients that do not have this, we can still complete the visit using a regular telephone but do prefer a smartphone to enable video when possible. You may have a family member that lives with you that can help. If possible, we also ask that you have a blood pressure cuff and scale at home to measure your blood pressure, heart rate and weight prior to your scheduled appointment. Patients with clinical needs that need an in-person evaluation and testing will still be able to come to the office if absolutely necessary. If you have any questions, feel free to call our office.     YOUR PROVIDER WILL BE USING THE FOLLOWING PLATFORM TO COMPLETE YOUR VISIT: [email or web address]  . IF USING MYCHART - How to Download the MyChart App to Your SmartPhone   - If Apple, go to [employer] and type in MyChart in the search bar and download the app. If Android, ask patient to go to [employer] and type in [place] in the search bar and download the app. The app is free but as with any other app downloads, your phone may require you to verify saved payment information or Apple/Android password.  - You will need to then log into the app with your MyChart username and password, and select [facility] as your healthcare provider to link the account.  - When it is time for your visit, go to the  MyChart app, find appointments, and click Begin Video Visit. Be sure to Select Allow for your device to access the Microphone and Camera for your visit. You will then be connected, and your provider will be with you shortly.  **If you have any issues connecting or need assistance, please contact MyChart service desk [phone])**  **If using a computer, in order to ensure the best quality for your visit, you will need to use either of the following Internet Browsers: Insurance underwriter or [employer]**  . IF USING DOXIMITY or [email or web address] - The staff will give you instructions on receiving your link to join the meeting the day of your visit.      2-3 DAYS BEFORE YOUR APPOINTMENT  You will receive a telephone call from one of our [facility] team members - your caller ID may say "Unknown caller." If this is a video visit, we will walk you through how to get the video launched on your phone. We will remind you check your blood pressure, heart rate and weight prior to your scheduled appointment. If you have an Apple Watch or Kardia, please upload any pertinent ECG strips the day before or morning of your appointment to [facility]. Our staff will also make sure you have reviewed the consent and agree to move forward with your scheduled tele-health visit.     THE DAY OF YOUR APPOINTMENT  Approximately 15 minutes prior to your scheduled  appointment, you will receive a telephone call from one of [facility] team - your caller ID may say "Unknown caller."  Our staff will confirm medications, vital signs for the day and any symptoms you may be experiencing. Please have this information available prior to the time of visit start. It may also be helpful for you to have a pad of paper and pen handy for any instructions given during your visit. They will also walk you through joining the smartphone meeting if this is a video visit.    CONSENT FOR TELE-HEALTH VISIT - PLEASE REVIEW  I hereby voluntarily  request, consent and authorize [facility] and its employed or contracted physicians, physician assistants, nurse practitioners or other licensed health care professionals (the Practitioner), to provide me with telemedicine health care services (the "Services") as deemed necessary by the treating Practitioner. I acknowledge and consent to receive the Services by the Practitioner via telemedicine. I understand that the telemedicine visit will involve communicating with the Practitioner through live audiovisual communication technology and the disclosure of certain medical information by electronic transmission. I acknowledge that I have been given the opportunity to request an in-person assessment or other available alternative prior to the telemedicine visit and am voluntarily participating in the telemedicine visit.  I understand that I have the right to withhold or withdraw my consent to the use of telemedicine in the course of my care at any time, without affecting my right to future care or treatment, and that the Practitioner or I may terminate the telemedicine visit at any time. I understand that I have the right to inspect all information obtained and/or recorded in the course of the telemedicine visit and may receive copies of available information for a reasonable fee.  I understand that some of the potential risks of receiving the Services via telemedicine include:  [name] Delay or interruption in medical evaluation due to technological equipment failure or disruption; . Information transmitted may not be sufficient (e.g. poor resolution of images) to allow for appropriate medical decision making by the Practitioner; and/or  . In rare instances, security protocols could fail, causing a breach of personal health information.  Furthermore, I acknowledge that it is my responsibility to provide information about my medical history, conditions and care that is complete and accurate to the best of my  ability. I acknowledge that Practitioner's advice, recommendations, and/or decision may be based on factors not within their control, such as incomplete or inaccurate data provided by me or distortions of diagnostic images or specimens that may result from electronic transmissions. I understand that the practice of medicine is not an exact science and that Practitioner makes no warranties or guarantees regarding treatment outcomes. I acknowledge that I will receive a copy of this consent concurrently upon execution via email to the email address I last provided but may also request a printed copy by calling the office of [facility].    I understand that my insurance will be billed for this visit.   I have read or had this consent read to me. . I understand the contents of this consent, which adequately explains the benefits and risks of the Services being provided via telemedicine.  . I have been provided ample opportunity to ask questions regarding this consent and the Services and have had my questions answered to my satisfaction. . I give my informed consent for the services to be provided through the use of telemedicine in my medical care  By participating in this telemedicine  visit I agree to the above.  

## 2018-07-15 ENCOUNTER — Other Ambulatory Visit: Payer: Self-pay

## 2018-07-15 ENCOUNTER — Telehealth (INDEPENDENT_AMBULATORY_CARE_PROVIDER_SITE_OTHER): Payer: Medicare Other | Admitting: Cardiovascular Disease

## 2018-07-15 ENCOUNTER — Encounter: Payer: Self-pay | Admitting: Cardiovascular Disease

## 2018-07-15 VITALS — BP 112/77 | HR 68 | Ht 70.0 in | Wt 186.0 lb

## 2018-07-15 DIAGNOSIS — I350 Nonrheumatic aortic (valve) stenosis: Secondary | ICD-10-CM

## 2018-07-15 NOTE — Progress Notes (Signed)
Virtual Visit via Video Note   This visit type was conducted due to national recommendations for restrictions regarding the COVID-19 Pandemic (e.g. social distancing) in an effort to limit this patient's exposure and mitigate transmission in our community.  Due to his co-morbid illnesses, this patient is at least at moderate risk for complications without adequate follow up.  This format is felt to be most appropriate for this patient at this time.  All issues noted in this document were discussed and addressed.  A limited physical exam was performed with this format.  Please refer to the patient's chart for his consent to telehealth for [facility].   Date:  07/15/2018   ID:  Sean Abbott, DOB May 15, 1945, MRN [id number]  Patient Location: Home Provider Location: Home  PCP:  Default, Provider, MD  Cardiologist:  No primary care provider on file.  Electrophysiologist:  None   Evaluation Performed:  New Patient Evaluation  Chief Complaint:  Aortic Stenosis  History of Present Illness:    Sean Abbott is a 73 y.o. male with history of moderate aortic stenosis and thoracic aortic aneurysm, presenting for evaluation of progressive, now severe aortic stenosis.  The patient has been followed by Dr. Alvester Chou in the past, last seen in the office in 2014.  He moved to [facility] several years ago and was recently noted to have worsening aortic stenosis by echo assessment in January 2020.  The patient had a remote cardiac catheterization in 2007 demonstrating normal coronary arteries.  This was performed after an abnormal exercise ECG.  Echo images are not currently available, but echo report from February 26, 2018 is pertinent for normal LV systolic function with LVEF 65%, calcified and restricted aortic valve with a peak systolic velocity of 4.6 m/s, mean gradient 49 mmHg, and peak gradient 84 mmHg.  There is no pericardial effusion.  RV function is reported as normal.  There is no other  valvular disease noted on the study report.  A recent exercise treadmill study was also performed.  The patient was able to achieve 7.8 metabolic equivalents on the Bruce protocol with 2 to 3 mm of ST depression reported during exercise, persisting 5 minutes into recovery.  The patient experienced shortness of breath and fatigue without level of activity.  The patient retired from [employer] in 2008 and moved to [place] to be closer to his mother who had Alzheimer's dementia.   He was first diagnosed with a heart murmur in 2007 and was diagnosed with aortic stenosis a few years later. He has followed annually with a cardiologist in [place] and was noted to have progressive aortic stenosis on his most recent echo study. He complains of progressive fatigue, exercise intolerance, and shortness of breath. Over time he has been consistent with a walking program, elliptical, planks, and other exercises. He has 'lost stamina' slowly over the past year. He is more fatigued on the back 9 holes when he plays golf. He denies chest pain or pressure with exertion. He has experienced postural dizziness in the past, but no recent problems with this.   The patient does not have symptoms concerning for COVID-19 infection (fever, chills, cough, or new shortness of breath).   He has been healthy over the years, and was quite active when he was younger.  He played hockey well into his 85s.   Past Medical History:  Diagnosis Date   Aortic valve disorder    2D ECHO, 12/05/2011 - EF >55%, moderate calcification of  the aortic valve leaflets, moderate valvular aortic stenosis   Chest pain, atypical    NUCLEAR STRESS TEST, 11/14/2005 - ECG positive for ischemia   Claudication ([place])    LEA DUPLEX SCAN, 01/23/2009 - normal, no evidence of arterial insufficiency   Hyperlipidemia    Hypertension    Skin cancer    Skin disorder    Thoracic aortic aneurysm [facility])    Past Surgical History:  Procedure Laterality Date    CARDIAC CATHETERIZATION  12/05/2005   Normal coronary arteries, rule out thoracic aortic aneurysm   MOUTH SURGERY  2003   TENDON REPAIR Left 2005   [place]     Current Meds  Medication Sig   aspirin EC 81 MG tablet Take 81 mg by mouth at bedtime.    CRESTOR 10 MG tablet TAKE 1 TABLET BY MOUTH EVERY OTHER DAY (Patient taking differently: Take 10 mg by mouth every other day. IN THE EVENING.)   Glucosamine-Chondroitin (COSAMIN DS PO) Take 1 tablet by mouth daily.   Multiple Vitamin (MULTIVITAMIN WITH MINERALS) TABS tablet Take 1 tablet by mouth daily.   naproxen sodium (ALEVE) 220 MG tablet Take 440 mg by mouth 2 (two) times daily as needed (pain.).     Allergies:   Demerol [meperidine] and Lipitor [atorvastatin]   Social History   Tobacco Use   Smoking status: Never Smoker   Smokeless tobacco: Never Used  Substance Use Topics   Alcohol use: Yes   Drug use: Not on file     Family Hx: The patient's family history includes Diabetes in his father; Heart disease in his father; Hypertension in his father.  ROS:   Please see the history of present illness.    All other systems reviewed and are negative.   Prior CV studies:   The following studies were reviewed today:  Echo as outlined in the HPI.   Labs/Other Tests and Data Reviewed:    EKG:  No ECG reviewed.  Recent Labs: No results found for requested labs within last 8760 hours.   Recent Lipid Panel No results found for: CHOL, TRIG, HDL, CHOLHDL, LDLCALC, LDLDIRECT  Wt Readings from Last 3 Encounters:  07/15/18 186 lb (84.4 kg)  07/07/18 187 lb (84.8 kg)  09/10/12 189 lb (85.7 kg)     Objective:    Vital Signs:  BP 112/77    Pulse 68    Ht 5\' 10"  (1.778 m)    Wt 186 lb (84.4 kg)    SpO2 97%    BMI 26.69 kg/m    VITAL SIGNS:  reviewed The patient is alert, oriented, in no distress.  He is breathing comfortably in normal conversation.  Remaining exam is deferred secondary to telehealth visit  type.  STS Risk Calculator - Isolated AVR: Risk of Mortality: 0.867% Renal Failure: 1.554% Permanent Stroke: 1.242% Prolonged Ventilation: 3.116% DSW Infection: 0.079% Reoperation: 3.680% Morbidity or Mortality: 6.724% Short Length of Stay: 55.358% Long Length of Stay: 2.470%  ASSESSMENT & PLAN:    73 year old gentleman with severe, stage D1 aortic stenosis and 4.2 cm ascending aortic aneurysm.  I have reviewed the natural history of aortic stenosis with the patient and his wife today.  Both are present for this video conferencing encounter.  We have discussed the limitations of medical therapy and the poor prognosis associated with symptomatic aortic stenosis. We have reviewed potential treatment options, including palliative medical therapy, conventional surgical aortic valve replacement, and transcatheter aortic valve replacement. We discussed treatment options in the context of  the patient's specific comorbid medical conditions.  We reviewed specific pros and cons of TAVR and conventional surgical AVR.  For this particular patient who is at low risk, he understands the potential benefits of TAVR include lower short-term risks of complications and more rapid recovery.  He understands potential benefit of surgical AVR include lower risk of paravalvular regurgitation and more data on long-term durability.  It is important to note that the patient has right bundle branch block and history of bifascicular block documented by his cardiologist.  This will clearly place him at higher risk of permanent pacemaker as a complication of both transcatheter and surgical aortic valve replacement.  All of this will be factored in to his evaluation and treatment recommendations.  He is scheduled to undergo right and left heart catheterization as well as a gated cardiac CTA and a CTA of the chest, abdomen and pelvis in the near future.  CTA studies will better define the current status of the patient's  thoracic aortic aneurysm.  We will try to arrange surgical consultation at the time of his studies as part of a multidisciplinary approach to his care.  Final recommendations regarding treatment options will be made based on the results of his preoperative testing.  COVID-19 Education: The signs and symptoms of COVID-19 were discussed with the patient and how to seek care for testing (follow up with PCP or arrange E-visit). The importance of social distancing was discussed today.  Time:   Today, I have spent 60 minutes with the patient with telehealth technology discussing the above problems.     Medication Adjustments/Labs and Tests Ordered: Current medicines are reviewed at length with the patient today.  Concerns regarding medicines are outlined above.   Tests Ordered: No orders of the defined types were placed in this encounter.   Medication Changes: No orders of the defined types were placed in this encounter.   Disposition:  Follow up as outlined  Signed, Sherren Mocha, MD  07/15/2018 11:13 AM    [place]

## 2018-07-15 NOTE — H&P (View-Only) (Signed)
Thanks for seeing Sean Abbott. He is a nice guy

## 2018-07-15 NOTE — Progress Notes (Signed)
Thanks for seeing Sean Abbott. He is a nice guy

## 2018-07-16 ENCOUNTER — Encounter (HOSPITAL_COMMUNITY): Payer: Self-pay | Admitting: Emergency Medicine

## 2018-07-16 ENCOUNTER — Other Ambulatory Visit: Payer: Self-pay

## 2018-07-16 LAB — CBC
Hematocrit: 45.2 % (ref 37.5–51.0)
Hemoglobin: 15.3 g/dL (ref 13.0–17.7)
MCH: 31.9 pg (ref 26.6–33.0)
MCHC: 33.8 g/dL (ref 31.5–35.7)
MCV: 94 fL (ref 79–97)
Platelets: 176 10*3/uL (ref 150–450)
RBC: 4.8 x10E6/uL (ref 4.14–5.80)
RDW: 12.1 % (ref 11.6–15.4)
WBC: 6.4 10*3/uL (ref 3.4–10.8)

## 2018-07-16 LAB — BASIC METABOLIC PANEL
BUN/Creatinine Ratio: 22 (ref 10–24)
BUN: 21 mg/dL (ref 8–27)
CO2: 25 mmol/L (ref 20–29)
Calcium: 9.4 mg/dL (ref 8.6–10.2)
Chloride: 102 mmol/L (ref 96–106)
Creatinine, Ser: 0.95 mg/dL (ref 0.76–1.27)
GFR calc Af Amer: 92 mL/min/{1.73_m2} (ref >59)
GFR calc non Af Amer: 80 mL/min/{1.73_m2} (ref >59)
Glucose: 70 mg/dL (ref 65–99)
Potassium: 4.2 mmol/L (ref 3.5–5.2)
Sodium: 144 mmol/L (ref 134–144)

## 2018-07-17 ENCOUNTER — Ambulatory Visit (HOSPITAL_COMMUNITY)
Admission: RE | Admit: 2018-07-17 | Discharge: 2018-07-17 | Disposition: A | Discharge status: Home / Self Care | Payer: Medicare Other | Source: Ambulatory Visit | Attending: Cardiovascular Disease | Admitting: Cardiovascular Disease

## 2018-07-17 ENCOUNTER — Other Ambulatory Visit (HOSPITAL_COMMUNITY): Payer: Medicare Other

## 2018-07-17 ENCOUNTER — Ambulatory Visit (HOSPITAL_COMMUNITY): Payer: Medicare Other

## 2018-07-17 ENCOUNTER — Other Ambulatory Visit (HOSPITAL_COMMUNITY)
Admission: RE | Admit: 2018-07-17 | Discharge: 2018-07-17 | Disposition: A | Discharge status: Home / Self Care | Payer: Medicare Other | Source: Ambulatory Visit | Attending: Cardiovascular Disease | Admitting: Cardiovascular Disease

## 2018-07-17 ENCOUNTER — Other Ambulatory Visit: Payer: Self-pay

## 2018-07-17 DIAGNOSIS — Z1159 Encounter for screening for other viral diseases: Secondary | ICD-10-CM | POA: Diagnosis not present

## 2018-07-17 DIAGNOSIS — I712 Thoracic aortic aneurysm, without rupture, unspecified: Secondary | ICD-10-CM

## 2018-07-17 DIAGNOSIS — I350 Nonrheumatic aortic (valve) stenosis: Secondary | ICD-10-CM

## 2018-07-17 LAB — SARS CORONAVIRUS 2 BY RT PCR (HOSPITAL ORDER, PERFORMED IN ~~LOC~~ HOSPITAL LAB): SARS Coronavirus 2: NEGATIVE

## 2018-07-17 MED ORDER — IOHEXOL 350 MG/ML SOLN
100.0000 mL | Freq: Once | INTRAVENOUS | Status: AC | PRN
  Administered 2018-07-17: 100 mL via INTRAVENOUS

## 2018-07-20 ENCOUNTER — Other Ambulatory Visit: Payer: Self-pay

## 2018-07-20 ENCOUNTER — Encounter (HOSPITAL_COMMUNITY): Payer: Self-pay | Admitting: Cardiovascular Disease

## 2018-07-20 ENCOUNTER — Ambulatory Visit (HOSPITAL_COMMUNITY)
Admission: RE | Admit: 2018-07-20 | Discharge: 2018-07-20 | Disposition: A | Discharge status: Home / Self Care | Payer: Medicare Other | Attending: Cardiovascular Disease | Admitting: Cardiovascular Disease

## 2018-07-20 ENCOUNTER — Encounter (HOSPITAL_COMMUNITY)
Admission: RE | Disposition: A | Discharge status: Home / Self Care | Payer: Medicare Other | Source: Home / Self Care | Attending: Cardiovascular Disease

## 2018-07-20 ENCOUNTER — Institutional Professional Consult (permissible substitution) (INDEPENDENT_AMBULATORY_CARE_PROVIDER_SITE_OTHER): Payer: Medicare Other | Admitting: Surgery

## 2018-07-20 VITALS — BP 155/88 | HR 70 | Temp 96.7°F | Resp 20 | Ht 70.0 in | Wt 187.0 lb

## 2018-07-20 DIAGNOSIS — E785 Hyperlipidemia, unspecified: Secondary | ICD-10-CM | POA: Insufficient documentation

## 2018-07-20 DIAGNOSIS — I712 Thoracic aortic aneurysm, without rupture: Secondary | ICD-10-CM | POA: Diagnosis not present

## 2018-07-20 DIAGNOSIS — I350 Nonrheumatic aortic (valve) stenosis: Secondary | ICD-10-CM | POA: Diagnosis present

## 2018-07-20 DIAGNOSIS — I10 Essential (primary) hypertension: Secondary | ICD-10-CM | POA: Insufficient documentation

## 2018-07-20 HISTORY — PX: RIGHT HEART CATH AND CORONARY ANGIOGRAPHY: CATH118264

## 2018-07-20 LAB — POCT I-STAT EG7
Acid-base deficit: 1 mmol/L (ref 0.0–2.0)
Acid-base deficit: 1 mmol/L (ref 0.0–2.0)
Bicarbonate: 23.8 mmol/L (ref 20.0–28.0)
Bicarbonate: 24 mmol/L (ref 20.0–28.0)
Calcium, Ion: 1.23 mmol/L (ref 1.15–1.40)
Calcium, Ion: 1.25 mmol/L (ref 1.15–1.40)
HCT: 41 % (ref 39.0–52.0)
HCT: 41 % (ref 39.0–52.0)
Hemoglobin: 13.9 g/dL (ref 13.0–17.0)
Hemoglobin: 13.9 g/dL (ref 13.0–17.0)
O2 Saturation: 85 %
O2 Saturation: 86 %
Potassium: 4 mmol/L (ref 3.5–5.1)
Potassium: 4 mmol/L (ref 3.5–5.1)
Sodium: 143 mmol/L (ref 135–145)
Sodium: 144 mmol/L (ref 135–145)
TCO2: 25 mmol/L (ref 22–32)
TCO2: 25 mmol/L (ref 22–32)
pCO2, Ven: 40.5 mmHg — ABNORMAL LOW (ref 44.0–60.0)
pCO2, Ven: 41 mmHg — ABNORMAL LOW (ref 44.0–60.0)
pH, Ven: 7.376 (ref 7.250–7.430)
pH, Ven: 7.377 (ref 7.250–7.430)
pO2, Ven: 50 mmHg — ABNORMAL HIGH (ref 32.0–45.0)
pO2, Ven: 53 mmHg — ABNORMAL HIGH (ref 32.0–45.0)

## 2018-07-20 LAB — POCT I-STAT 7, (LYTES, BLD GAS, ICA,H+H)
Bicarbonate: 24.1 mmol/L (ref 20.0–28.0)
Calcium, Ion: 1.23 mmol/L (ref 1.15–1.40)
HCT: 41 % (ref 39.0–52.0)
Hemoglobin: 13.9 g/dL (ref 13.0–17.0)
O2 Saturation: 100 %
Potassium: 4 mmol/L (ref 3.5–5.1)
Sodium: 143 mmol/L (ref 135–145)
TCO2: 25 mmol/L (ref 22–32)
pCO2 arterial: 38.2 mmHg (ref 32.0–48.0)
pH, Arterial: 7.407 (ref 7.350–7.450)
pO2, Arterial: 199 mmHg — ABNORMAL HIGH (ref 83.0–108.0)

## 2018-07-20 SURGERY — RIGHT HEART CATH AND CORONARY ANGIOGRAPHY
Anesthesia: LOCAL

## 2018-07-20 MED ORDER — IOHEXOL 350 MG/ML SOLN
INTRAVENOUS | Status: DC | PRN
  Administered 2018-07-20: 125 mL via INTRA_ARTERIAL

## 2018-07-20 MED ORDER — HEPARIN (PORCINE) IN NACL 1000-0.9 UT/500ML-% IV SOLN
INTRAVENOUS | Status: DC | PRN
  Administered 2018-07-20 (×2): 500 mL

## 2018-07-20 MED ORDER — LABETALOL HCL 5 MG/ML IV SOLN: 10.0000 mg | INTRAVENOUS | Status: DC | PRN

## 2018-07-20 MED ORDER — SODIUM CHLORIDE 0.9 % IV SOLN: 250.0000 mL | INTRAVENOUS | Status: DC | PRN

## 2018-07-20 MED ORDER — ACETAMINOPHEN 325 MG PO TABS: 650.0000 mg | ORAL_TABLET | ORAL | Status: DC | PRN

## 2018-07-20 MED ORDER — SODIUM CHLORIDE 0.9% FLUSH: 3.0000 mL | Freq: Two times a day (BID) | INTRAVENOUS | Status: DC

## 2018-07-20 MED ORDER — SODIUM CHLORIDE 0.9 % WEIGHT BASED INFUSION
3.0000 mL/kg/h | INTRAVENOUS | Status: AC
  Administered 2018-07-20: 06:00:00 3 mL/kg/h via INTRAVENOUS

## 2018-07-20 MED ORDER — SODIUM CHLORIDE 0.9 % WEIGHT BASED INFUSION: 1.0000 mL/kg/h | INTRAVENOUS | Status: DC

## 2018-07-20 MED ORDER — LIDOCAINE HCL (PF) 1 % IJ SOLN
INTRAMUSCULAR | Status: AC
  Filled 2018-07-20: qty 30

## 2018-07-20 MED ORDER — NITROGLYCERIN 1 MG/10 ML FOR IR/CATH LAB
INTRA_ARTERIAL | Status: AC
  Filled 2018-07-20: qty 10

## 2018-07-20 MED ORDER — SODIUM CHLORIDE 0.9 % IV SOLN: INTRAVENOUS | Status: DC

## 2018-07-20 MED ORDER — SODIUM CHLORIDE 0.9% FLUSH: 3.0000 mL | INTRAVENOUS | Status: DC | PRN

## 2018-07-20 MED ORDER — LIDOCAINE HCL (PF) 1 % IJ SOLN
INTRAMUSCULAR | Status: DC | PRN
  Administered 2018-07-20 (×2): 2 mL

## 2018-07-20 MED ORDER — VERAPAMIL HCL 2.5 MG/ML IV SOLN
INTRA_ARTERIAL | Status: DC | PRN
  Administered 2018-07-20: 10 mL via INTRA_ARTERIAL

## 2018-07-20 MED ORDER — ONDANSETRON HCL 4 MG/2ML IJ SOLN: 4.0000 mg | Freq: Four times a day (QID) | INTRAMUSCULAR | Status: DC | PRN

## 2018-07-20 MED ORDER — VERAPAMIL HCL 2.5 MG/ML IV SOLN
INTRAVENOUS | Status: AC
  Filled 2018-07-20: qty 2

## 2018-07-20 MED ORDER — HEPARIN (PORCINE) IN NACL 1000-0.9 UT/500ML-% IV SOLN
INTRAVENOUS | Status: AC
  Filled 2018-07-20: qty 1000

## 2018-07-20 MED ORDER — HEPARIN SODIUM (PORCINE) 1000 UNIT/ML IJ SOLN
INTRAMUSCULAR | Status: DC | PRN
  Administered 2018-07-20: 4500 [IU] via INTRAVENOUS

## 2018-07-20 MED ORDER — HYDRALAZINE HCL 20 MG/ML IJ SOLN: 10.0000 mg | INTRAMUSCULAR | Status: DC | PRN

## 2018-07-20 MED ORDER — ASPIRIN 81 MG PO CHEW
81.0000 mg | CHEWABLE_TABLET | ORAL | Status: AC
  Administered 2018-07-20: 81 mg via ORAL
  Filled 2018-07-20: qty 1

## 2018-07-20 MED ORDER — MORPHINE SULFATE (PF) 10 MG/ML IV SOLN: 2.0000 mg | INTRAVENOUS | Status: DC | PRN

## 2018-07-20 MED ORDER — ASPIRIN 81 MG PO CHEW: 81.0000 mg | CHEWABLE_TABLET | Freq: Every day | ORAL | Status: DC

## 2018-07-20 SURGICAL SUPPLY — 14 items
CATH BALLN WEDGE 5F 110CM (CATHETERS) ×2 IMPLANT
CATH INFINITI 5 FR 3DRC (CATHETERS) ×2 IMPLANT
CATH INFINITI JR4 5F (CATHETERS) ×2 IMPLANT
CATH OPTITORQUE TIG 4.0 5F (CATHETERS) ×2 IMPLANT
DEVICE RAD COMP TR BAND LRG (VASCULAR PRODUCTS) ×2 IMPLANT
GLIDESHEATH SLEND A-KIT 6F 22G (SHEATH) ×2 IMPLANT
GUIDEWIRE INQWIRE 1.5J.035X260 (WIRE) ×1 IMPLANT
INQWIRE 1.5J .035X260CM (WIRE) ×2
KIT HEART LEFT (KITS) ×2 IMPLANT
PACK CARDIAC CATHETERIZATION (CUSTOM PROCEDURE TRAY) ×2 IMPLANT
SHEATH GLIDE SLENDER 4/5FR (SHEATH) ×2 IMPLANT
TRANSDUCER W/STOPCOCK (MISCELLANEOUS) ×2 IMPLANT
TUBING CIL FLEX 10 FLL-RA (TUBING) ×2 IMPLANT
WIRE HI TORQ VERSACORE-J 145CM (WIRE) ×2 IMPLANT

## 2018-07-20 NOTE — Interval H&P Note (Signed)
Cath Lab Visit (complete for each Cath Lab visit)  Clinical Evaluation Leading to the Procedure:   ACS: No.  Non-ACS:    Anginal Classification: No Symptoms  Anti-ischemic medical therapy: No Therapy  Non-Invasive Test Results: No non-invasive testing performed  Prior CABG: No previous CABG      History and Physical Interval Note:  07/20/2018 7:27 AM  Sean Abbott  has presented today for surgery, with the diagnosis of arotic stenosis.  The various methods of treatment have been discussed with the patient and family. After consideration of risks, benefits and other options for treatment, the patient has consented to  Procedure(s): RIGHT/LEFT HEART CATH AND CORONARY ANGIOGRAPHY (N/A) as a surgical intervention.  The patient's history has been reviewed, patient examined, no change in status, stable for surgery.  I have reviewed the patient's chart and labs.  Questions were answered to the patient's satisfaction.     Sean Abbott

## 2018-07-20 NOTE — Discharge Instructions (Signed)
Radial Site Care ° °This sheet gives you information about how to care for yourself after your procedure. Your health care provider may also give you more specific instructions. If you have problems or questions, contact your health care provider. °What can I expect after the procedure? °After the procedure, it is common to have: °· Bruising and tenderness at the catheter insertion area. °Follow these instructions at home: °Medicines °· Take over-the-counter and prescription medicines only as told by your health care provider. °Insertion site care °· Follow instructions from your health care provider about how to take care of your insertion site. Make sure you: °? Wash your hands with soap and water before you change your bandage (dressing). If soap and water are not available, use hand sanitizer. °? Change your dressing as told by your health care provider. °? Leave stitches (sutures), skin glue, or adhesive strips in place. These skin closures may need to stay in place for 2 weeks or longer. If adhesive strip edges start to loosen and curl up, you may trim the loose edges. Do not remove adhesive strips completely unless your health care provider tells you to do that. °· Check your insertion site every day for signs of infection. Check for: °? Redness, swelling, or pain. °? Fluid or blood. °? Pus or a bad smell. °? Warmth. °· Do not take baths, swim, or use a hot tub until your health care provider approves. °· You may shower 24-48 hours after the procedure, or as directed by your health care provider. °? Remove the dressing and gently wash the site with plain soap and water. °? Pat the area dry with a clean towel. °? Do not rub the site. That could cause bleeding. °· Do not apply powder or lotion to the site. °Activity ° °· For 24 hours after the procedure, or as directed by your health care provider: °? Do not flex or bend the affected arm. °? Do not push or pull heavy objects with the affected arm. °? Do not  drive yourself home from the hospital or clinic. You may drive 24 hours after the procedure unless your health care provider tells you not to. °? Do not operate machinery or power tools. °· Do not lift anything that is heavier than 10 lb (4.5 kg), or the limit that you are told, until your health care provider says that it is safe. °· Ask your health care provider when it is okay to: °? Return to work or school. °? Resume usual physical activities or sports. °? Resume sexual activity. °General instructions °· If the catheter site starts to bleed, raise your arm and put firm pressure on the site. If the bleeding does not stop, get help right away. This is a medical emergency. °· If you went home on the same day as your procedure, a responsible adult should be with you for the first 24 hours after you arrive home. °· Keep all follow-up visits as told by your health care provider. This is important. °Contact a health care provider if: °· You have a fever. °· You have redness, swelling, or yellow drainage around your insertion site. °Get help right away if: °· You have unusual pain at the radial site. °· The catheter insertion area swells very fast. °· The insertion area is bleeding, and the bleeding does not stop when you hold steady pressure on the area. °· Your arm or hand becomes pale, cool, tingly, or numb. °These symptoms may represent a serious problem   that is an emergency. Do not wait to see if the symptoms will go away. Get medical help right away. Call your local emergency services (911 in the [place] Do not drive yourself to the hospital. °Summary °· After the procedure, it is common to have bruising and tenderness at the site. °· Follow instructions from your health care provider about how to take care of your radial site wound. Check the wound every day for signs of infection. °· Do not lift anything that is heavier than 10 lb (4.5 kg), or the limit that you are told, until your health care provider says  that it is safe. °This information is not intended to replace advice given to you by your health care provider. Make sure you discuss any questions you have with your health care provider. °Document Released: 03/09/2010 Document Revised: 03/12/2017 Document Reviewed: 03/12/2017 °Elsevier Interactive Patient Education © 2019 [employer] ° °

## 2018-07-20 NOTE — Progress Notes (Signed)
HEART AND [facility]GERY CONSULTATION REPORT  Referring Provider is Lorretta Harp, MD Primary Cardiologist is No primary care provider on file. PCP is Default, Provider, MD  Chief Complaint  Patient presents with   Aortic Stenosis    Surgical eval TAVR vs AVR, review all studies    HPI:  The patient is a 73 year old active gentleman with history of hypertension, hyperlipidemia, bicuspid aortic valve disease with and known aortic stenosis.  He was first diagnosed with a heart murmur in 2007 and has been followed by a cardiologist in [place] where he currently lives.  He was noted to have progressive aortic stenosis on his most recent echocardiogram with a mean gradient of 49 mm across aortic valve.  He is an active gentleman who plays golf 3 days/week and goes to the gym on the other days of the week.  He has had progressive exertional fatigue and shortness of breath over the past year and only can walk about 9 holes playing golf for yes stop.  He has had no chest pain or pressure.  Has had no dizziness or syncope.  Denies peripheral edema.  He has had no orthopnea.  He is here today with his wife.  They live in [place].  He retired from [employer] in 2008 and moved to [place] to be closer to his mother who passed away with Alzheimer's dementia in January 2020 at the age of 22.  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  Past Medical History:  Diagnosis Date   Aortic valve disorder    2D ECHO, 12/05/2011 - EF >55%, moderate calcification of the aortic valve leaflets, moderate valvular aortic stenosis   Chest pain, atypical    NUCLEAR STRESS TEST, 11/14/2005 - ECG positive for ischemia   Claudication ([place])    LEA DUPLEX SCAN, 01/23/2009 - normal, no evidence of arterial insufficiency   Hyperlipidemia    Hypertension    Skin cancer    Skin disorder    Thoracic aortic aneurysm [facility])     Past Surgical  History:  Procedure Laterality Date   CARDIAC CATHETERIZATION  12/05/2005   Normal coronary arteries, rule out thoracic aortic aneurysm   MOUTH SURGERY  2003   RIGHT HEART CATH AND CORONARY ANGIOGRAPHY N/A 07/20/2018   Procedure: RIGHT HEART CATH AND CORONARY ANGIOGRAPHY;  Surgeon: Lorretta Harp, MD;  Location: [facility];  Service: Cardiovascular;  Laterality: N/A;   TENDON REPAIR Left 2005   Elbow    Family History  Problem Relation Age of Onset   Heart disease Father    Diabetes Father    Hypertension Father     Social History   Socioeconomic History   Marital status: Married    Spouse name: Not on file   Number of children: Not on file   Years of education: Not on file   Highest education level: Not on file  Occupational History   Not on file  Social Needs   Financial resource strain: Not on file   Food insecurity:    Worry: Not on file    Inability: Not on file   Transportation needs:    Medical: Not on file    Non-medical: Not on file  Tobacco Use   Smoking status: Never Smoker   Smokeless tobacco: Never Used  Substance and Sexual Activity   Alcohol use: Yes   Drug use: Not on file   Sexual activity: Not on file  Lifestyle   Physical activity:    Days per week: Not on file    Minutes per session: Not on file   Stress: Not on file  Relationships   Social connections:    Talks on phone: Not on file    Gets together: Not on file    Attends religious service: Not on file    Active member of club or organization: Not on file    Attends meetings of clubs or organizations: Not on file    Relationship status: Not on file   Intimate partner violence:    Fear of current or ex partner: Not on file    Emotionally abused: Not on file    Physically abused: Not on file    Forced sexual activity: Not on file  Other Topics Concern   Not on file  Social History Narrative   Not on file    Current Outpatient Medications    Medication Sig Dispense Refill   aspirin EC 81 MG tablet Take 81 mg by mouth at bedtime.      CRESTOR 10 MG tablet TAKE 1 TABLET BY MOUTH EVERY OTHER DAY (Patient taking differently: Take 10 mg by mouth every other day. IN THE EVENING.) 30 tablet 3   Glucosamine-Chondroitin (COSAMIN DS PO) Take 1 tablet by mouth daily.     Multiple Vitamin (MULTIVITAMIN WITH MINERALS) TABS tablet Take 1 tablet by mouth daily.     naproxen sodium (ALEVE) 220 MG tablet Take 440 mg by mouth 2 (two) times daily as needed (pain.).     No current facility-administered medications for this visit.     Allergies  Allergen Reactions   Demerol [Meperidine] Other (See Comments)    HOT/AGITATED   Lipitor [Atorvastatin] Other (See Comments)    Myalgias      Review of Systems:   General:  normal appetite, + decreased energy, no weight gain, no weight loss, no fever  Cardiac:  no chest pain with exertion, no chest pain at rest, +SOB with  exertion, no resting SOB, no PND, no orthopnea, no palpitations, no arrhythmia, no atrial fibrillation, no LE edema, no dizzy spells, no syncope  Respiratory:  + exertional shortness of breath, no home oxygen, no productive cough, no dry cough, no bronchitis, no wheezing, no hemoptysis, no asthma, no pain with inspiration or cough, no sleep apnea, no CPAP at night  GI:   no difficulty swallowing, no reflux, no frequent heartburn, no hiatal hernia, no abdominal pain, no constipation, no diarrhea, no hematochezia, no hematemesis, no melena  GU:   no dysuria,  no frequency, no urinary tract infection, no hematuria, no enlarged prostate, no kidney stones, no kidney disease  Vascular:  no pain suggestive of claudication, no pain in feet, no leg cramps, no varicose veins, no DVT, no non-healing foot ulcer  Neuro:   no stroke, no TIA's, no seizures, no headaches, no temporary blindness one eye,  no slurred speech, no peripheral neuropathy, no chronic pain, no instability of gait, no  memory/cognitive dysfunction  Musculoskeletal: no arthritis, no joint swelling, no myalgias, no difficulty walking, normal mobility   Skin:   no rash, no itching, no skin infections, no pressure sores or ulcerations  Psych:   no anxiety, no depression, no nervousness, no unusual recent stress  Eyes:   no blurry vision, no floaters, no recent vision changes, does not wear glasses or contacts  ENT:   no hearing loss, no loose or painful teeth, no dentures, last saw dentist  2019  Hematologic:  no easy bruising, no abnormal bleeding, no clotting disorder, no frequent epistaxis  Endocrine:  no diabetes, does not check CBG's at home      Physical Exam:   BP (!) 155/88    Pulse 70    Temp (!) 96.7 F (35.9 C) (Skin)    Resp 20    Ht 5\' 10"  (1.778 m)    Wt 84.8 kg    SpO2 97% Comment: RA   BMI 26.83 kg/m   General:  Fit and well-appearing  HEENT:  Unremarkable, NCAT, PERLA, EOMI  Neck:   no JVD, no bruits, no adenopathy or thyromegaly  Chest:   clear to auscultation, symmetrical breath sounds, no wheezes, no rhonchi   CV:   RRR, grade III/VI crescendo/decrescendo murmur heard best at RSB,  no diastolic murmur  Abdomen:  soft, non-tender, no masses   Extremities:  warm, well-perfused, pulses palpable, no LE edema  Rectal/GU  Deferred  Neuro:   Grossly non-focal and symmetrical throughout  Skin:   Clean and dry, no rashes, no breakdown   Diagnostic Tests:  Most recent echo from 02/26/2018 done in [facility] was reviewed.  Aortic valve mean gradient is 49 mmHg.  Aortic valve area 0.8 cm.  Left ventricular ejection fraction 65%.  A sending aortic diameter was measured at 4.1 cm.   Physicians   Panel Physicians Referring Physician Case Authorizing Physician  Lorretta Harp, MD (Primary)    Procedures   RIGHT HEART CATH AND CORONARY ANGIOGRAPHY  Conclusion     Hemodynamic findings consistent with aortic valve stenosis.   SAKET HELLSTROM is a 73 y.o. male     [id number] LOCATION:  FACILITY: [facility]  PHYSICIAN: Quay Burow, M.D. [birth year]   DATE OF PROCEDURE:  07/20/2018  DATE OF DISCHARGE:     CARDIAC CATHETERIZATION     History obtained from chart review.Mr Witkop is a delightful70 year old, thin and fit-appearing, married Caucasian male, father of 2, grandfather to 3 grandchildren who Ilast saw in the office 09/10/2012.He is accompanied by his wife today. He has a history of normal coronary arteries by cath which I performed December 05, 2005. He did have a generous thoracic aorta at that time and a CT showed his thoracic aorta measuring 4 cm as well as mild to moderate aortic stenosis. His other problems include mild hyperlipidemia in Crestor. He denies chest pain or shortness of breath but does complain of just some generalized fatigue, though he does exercise several times a week. 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  He moved down to [facility] to be closer to his mom who had Alzheimer's. She apparently passed away this past 03/31/2022. He is currently retired. He plays golf 4 days a week. He notices that on the back 9 he has increasing dyspnea on exertion over the last couple years which is more noticeable. He did see a cardiologist down there and had an echo performed 02/26/2018 revealing normal LV function with a peak gradient of 84 mmHg and a valve area 0.82 cm. He clearly is to the point where he needs valve replacement. He may be a candidate for TAVR.  I referred him to Dr. Burt Knack who did a virtual video TAVR  consult.  The patient drove up from [facility] last week for right and left heart cath, CTA and TAVR evaluation.   IMPRESSION: Mr. Baldree  has a left dominant system with normal coronary arteries.  Unfortunately, I was unable to visualize the nondominant RCA but this was normal at the time of his last cardiac catheterization which I performed 12/05/2005.  He had excellent cardiac output.  He is scheduled for TAVR CTA evaluation.  Does have mild to moderate thoracic aortic root dilatation.  Dr. Burt Knack is aware and will see the patient prior to him leaving the hospital.  The sheaths were removed and a TR band was placed on the right wrist to achieve patent hemostasis.  The patient left lab in stable condition.  Quay Burow. MD, [facility] 07/20/2018 8:19 AM      Recommendations   Antiplatelet/Anticoag Recommend Aspirin 81mg  daily for moderate CAD.  Indications   Severe aortic stenosis [I35.0 (ICD-10-CM)]  Procedural Details   Technical Details PROCEDURE DESCRIPTION:   The patient was brought to the second floor [facility] cath lab in the postabsorptive state. He was not premedicated . His right wrist and antecubital fossaWere prepped and shaved in usual sterile fashion. Xylocaine 1% was used for local anesthesia. A 6 French sheath was inserted into the right radial artery using standard Seldinger technique.  A 5 French sheath was inserted into the right antecubital vein.  A 5 French balloontipped Swan-Ganz catheter was then advanced through the right heart chambers obtaining sequential pressures, and pulmonary artery blood sample for the determination of Fick cardiac output.  A 5 [place] TIG catheter, right Judkins and no torque catheters were used for selective coronary angiography.  The aortic valve was heavily calcified fluoroscopically.  I was never able to identify the nondominant right coronary artery.  Isovue dye was used for the entirety of the case.  Retrograde aortic pressure was monitored on the case.  The patient received radial cocktail via the SideArm sheath.  The patient received  4500 units of heparin  intravenously.    Estimated blood loss <50 mL.   During this procedure no sedation was administered.  Medications  (Filter: Administrations occurring from 07/20/18 0715 to 07/20/18 0820)  Medication Rate/Dose/Volume Action  Date Time   Heparin (Porcine) in NaCl 1000-0.9 UT/500ML-% SOLN (mL) 500 mL Given 07/20/18 0732   Total dose as of 07/20/18 1720 500 mL Given 0732   1,000 mL        lidocaine (PF) (XYLOCAINE) 1 % injection (mL) 2 mL Given 07/20/18 0741   Total dose as of 07/20/18 1720 2 mL Given 0747   4 mL        Radial Cocktail (Verapamil 5 mg, NTG, Lidocaine) (mL) 10 mL Given 07/20/18 0749   Total dose as of 07/20/18 1720        10 mL        heparin injection (Units) 4,500 Units Given 07/20/18 0753   Total dose as of 07/20/18 1720        4,500 Units        iohexol (OMNIPAQUE) 350 MG/ML injection (mL) 125 mL Given 07/20/18 0809   Total dose as of 07/20/18 1720        125 mL        Coronary Findings   Diagnostic  Dominance: Left  No diagnostic findings have been documented.  Intervention   No interventions have been documented.  Right Heart   Right Heart Pressures Hemodynamic findings consistent  with aortic valve stenosis. Right atrial pressure- 7/5 Right ventricular pressure-33/0 Pulmonary artery pressure- 32/11, mean 21 Pulmonary wedge pressure-V wave 17, V wave 18, mean 12 Cardiac output- 9.27 L/min with an index of 4.57 L/min/m  Coronary Diagrams   Diagnostic  Dominance: Left    Intervention   Implants    No implant documentation for this case.  Syngo Images   Show images for CARDIAC CATHETERIZATION  Images on Long Term Storage   Show images for Jovin, Fester to Procedure Log   Procedure Log    Hemo Data    Most Recent Value  Fick Cardiac Output 9.27 L/min  Fick Cardiac Output Index 4.57 (L/min)/BSA  RA A Wave 7 mmHg  RA V Wave 5 mmHg  RA Mean 3 mmHg  RV Systolic Pressure 33 mmHg  RV Diastolic Pressure 0 mmHg  RV EDP 6 mmHg  PA  Systolic Pressure 32 mmHg  PA Diastolic Pressure 11 mmHg  PA Mean 21 mmHg  PW A Wave 17 mmHg  PW V Wave 18 mmHg  PW Mean 12 mmHg  AO Systolic Pressure 478 mmHg  AO Diastolic Pressure 72 mmHg  AO Mean 97 mmHg  QP/QS 1  TPVR Index 4.6 HRUI  TSVR Index 21.25 HRUI  PVR SVR Ratio 0.1  TPVR/TSVR Ratio 0.22   ADDENDUM REPORT: 07/17/2018 12:52  EXAM: OVER-READ INTERPRETATION  CT CHEST  The following report is an over-read performed by radiologist Dr. Rebekah [place]field [facility] on 07/17/2018. This over-read does not include interpretation of cardiac or coronary anatomy or pathology. The cardiac CTA interpretation by the cardiologist is attached.  COMPARISON:  Chest CT 11/24/2012.  FINDINGS: Extracardiac findings will be described separately under dictation for contemporaneously obtained CTA chest, abdomen and pelvis.  IMPRESSION: Please see separate dictation for contemporaneously obtained CTA chest, abdomen and pelvis 07/17/2018 for full description of relevant extracardiac findings.   Electronically Signed   By: Vinnie Langton M.D.   On: 07/17/2018 12:52   Addended by Etheleen Mayhew, MD on 07/17/2018 12:55 PM    Study Result   CLINICAL DATA:  Aortic Stenosis  EXAM: Cardiac TAVR CT  TECHNIQUE: The patient was scanned on a Siemens Force 295 slice scanner. A 120 kV retrospective scan was triggered in the ascending thoracic aorta at 140 HU's. Gantry rotation speed was 250 msecs and collimation was .6 mm. No beta blockade or nitro were given. The 3D data set was reconstructed in 5% intervals of the R-R cycle. Systolic and diastolic phases were analyzed on a dedicated work station using MPR, MIP and VRT modes. The patient received 80 cc of contrast.  FINDINGS: Aortic Valve: Bicuspid and calcified with raphe between the right and left cusps  Aorta: Mild atherosclerosis with ascending root aneurysm and bovine arch  Sino-tubular  Junction: 32 mm  Ascending Thoracic Aorta: 45 mm  Aortic Arch: 23 mm  Descending Thoracic Aorta: 22 mm  Sinus of Valsalva Measurements:  Commissural Sinus: 32.6 mm  Non Commissural Sinus: 37.3 mm  Coronary Artery Height above Annulus:  Left Main: 17.5 mm above annulus  Right Coronary: 17 mm above annulus  Virtual Basal Annulus Measurements:  Maximum / Minimum Diameter: 30 mm x 25.9 mm  Perimeter: 90 mm  Area: 598 mm2  Coronary Arteries: Sufficient height above annulus for deployment  Optimum Fluoroscopic Angle for Delivery: LAO 16 Caudal 15 degrees  IMPRESSION: 1. Bicuspid AV with annular area of 598 mm2 suitable for a 29 mm Sapien  3 valve  2.  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  Coronary arteries sufficient height above annulus for deployment  4.  No LAA thrombus  5. Optimum angiographic angle for deployment LAO 16 Caudal 15 degrees  Jenkins Rouge  Electronically Signed: By: Jenkins Rouge M.D. On: 07/17/2018 11:34      CLINICAL DATA:  73 year old male with history of severe aortic stenosis. Preprocedural study prior to potential transcatheter aortic valve replacement (TAVR) procedure.  EXAM: CT ANGIOGRAPHY CHEST, ABDOMEN AND PELVIS  TECHNIQUE: Multidetector CT imaging through the chest, abdomen and pelvis was performed using the standard protocol during bolus administration of intravenous contrast. Multiplanar reconstructed images and MIPs were obtained and reviewed to evaluate the vascular anatomy.  CONTRAST:  181mL OMNIPAQUE IOHEXOL 350 MG/ML SOLN  COMPARISON:  Chest CTA 11/24/2012. CT the abdomen and pelvis 12/26/2008.  FINDINGS: CTA CHEST FINDINGS  Cardiovascular: Heart size is normal. There is no significant pericardial fluid, thickening or pericardial calcification. There is aortic atherosclerosis, as well as atherosclerosis of the great vessels of the mediastinum and the coronary arteries,  including calcified atherosclerotic plaque in the left anterior descending coronary artery. Ectasia of the ascending thoracic aorta (4.1 cm in diameter).  Mediastinum/Lymph Nodes: No pathologically enlarged mediastinal or hilar lymph nodes. Esophagus is unremarkable in appearance. No axillary lymphadenopathy.  Lungs/Pleura: No suspicious appearing pulmonary nodules or masses are noted. No acute consolidative airspace disease. No pleural effusions.  Musculoskeletal/Soft Tissues: There are no aggressive appearing lytic or blastic lesions noted in the visualized portions of the skeleton.  CTA ABDOMEN AND PELVIS FINDINGS  Hepatobiliary: No suspicious cystic or solid hepatic lesions. No intra or extrahepatic biliary ductal dilatation. Gallbladder is normal in appearance.  Pancreas: No pancreatic mass. No pancreatic ductal dilatation. No pancreatic or peripancreatic fluid or inflammatory changes.  Spleen: Unremarkable.  Adrenals/Urinary Tract: Bilateral kidneys and bilateral adrenal glands are normal in appearance. No hydroureteronephrosis. Urinary bladder is normal in appearance.  Stomach/Bowel: Normal appearance of the stomach. No pathologic dilatation of small bowel or colon. Numerous colonic diverticulae are noted, without surrounding inflammatory changes to suggest an acute diverticulitis at this time. Normal appendix.  Vascular/Lymphatic: Aortic atherosclerosis, with vascular findings and measurements pertinent to potential TAVR procedure, as detailed below. No aneurysm or dissection noted in the abdominal or pelvic vasculature. No lymphadenopathy noted in the abdomen or pelvis.  Reproductive: Prostate gland and seminal vesicles are unremarkable in appearance.  Other: No significant volume of ascites.  No pneumoperitoneum.  Musculoskeletal: There are no aggressive appearing lytic or blastic lesions noted in the visualized portions of the  skeleton.  VASCULAR MEASUREMENTS PERTINENT TO TAVR:  AORTA:  Minimal Aortic Diameter-1.5 x 1.2 mm  Severity of Aortic Calcification-mild  RIGHT PELVIS:  Right Common Iliac Artery -  Minimal Diameter-8.6 x 9.0 mm  Tortuosity-mild  Calcification-none  Right External Iliac Artery -  Minimal Diameter-8.3 x 8.4 mm  Tortuosity-severe  Calcification-minimal  Right Common Femoral Artery -  Minimal Diameter-8.4 x 6.6 mm  Tortuosity-mild  Calcification-mild  LEFT PELVIS:  Left Common Iliac Artery -  Minimal Diameter-9.7 x 10.0 mm  Tortuosity-mild  Calcification-minimal  Left External Iliac Artery -  Minimal Diameter-8.6 x 8.0 mm  Tortuosity-severe  Calcification-none  Left Common Femoral Artery -  Minimal Diameter-8.5 x 7.2 mm  Tortuosity-mild  Calcification-none  Review of the MIP images confirms the above findings.  IMPRESSION: 1. Vascular findings and measurements pertinent to potential TAVR procedure, as detailed above. 2. Severe thickening calcification of the aortic  valve, compatible with the reported clinical history of severe aortic stenosis. 3. Aortic atherosclerosis, in addition to left anterior descending coronary artery disease. In addition, there is ectasia of the ascending thoracic aorta (4.1 cm in diameter). Recommend annual imaging followup by CTA or MRA. This recommendation follows 2010 ACCF/AHA/AATS/ACR/ASA/SCA/SCAI/SIR/STS/SVM Guidelines for the Diagnosis and Management of Patients with Thoracic Aortic Disease. Circulation. 2010; 121: Y659-D357. Aortic aneurysm NOS (ICD10-I71.9). 4. Colonic diverticulosis without evidence of acute diverticulitis at this time. 5. Additional incidental findings, as above.   Electronically Signed   By: Vinnie Langton M.D.   On: 07/17/2018 14:31   STS Risk Calculator - Isolated AVR: Risk of Mortality: 0.867% Renal Failure: 1.554% Permanent  Stroke: 1.242% Prolonged Ventilation: 3.116% DSW Infection: 0.079% Reoperation: 3.680% Morbidity or Mortality: 6.724% Short Length of Stay: 55.358% Long Length of Stay: 2.470%   Impression:  This 73 year old gentleman has stage D, severe, symptomatic aortic stenosis with New York Heart Association class II-III symptoms of exertional fatigue and shortness of breath consistent with chronic diastolic congestive heart failure.  I have personally reviewed his 2D echocardiogram, cardiac catheterization, and CTA studies.  His echocardiogram shows a bicuspid aortic valve with a raphae between the right and left coronary cusp.  The valve leaflets are severely calcified with reduced mobility.  The mean gradient across the aortic valve was 49 mmHg consistent with severe aortic stenosis.  Left ventricular ejection fraction is normal.  Cardiac catheterization shows no significant coronary disease.  His CTA of the chest shows a 4.1 cm fusiform ascending aortic aneurysm which is unchanged compared to prior CT scans dating back to 12/12/2005 when it measured 4.0 cm.  The distal descending thoracic aorta above the diaphragm measures 2.1 cm.  I agree that aortic valve replacement is indicated in this patient with progressive symptoms of severe aortic stenosis to prevent progressive left ventricular deterioration and to improve his quality of life.  The patient was counseled at length regarding treatment alternatives for management of severe symptomatic aortic stenosis. The risks and benefits of surgical intervention has been discussed in detail. Long-term prognosis with medical therapy was discussed. Alternative approaches such as conventional surgical aortic valve replacement, transcatheter aortic valve replacement, and palliative medical therapy were compared and contrasted at length. This discussion was placed in the context of the patient's own specific clinical presentation and past medical history. All of  their questions been addressed.  He is very active playing golf and working out and would prefer being treated with TAVR rather than open surgical aortic valve placement.  He is a low risk surgical patient but given his age of 55 years I think TAVR is a reasonable alternative.  He does have an ascending aortic aneurysm measuring 4.1 cm but this is been essentially unchanged dating back to 2007.  This is still well below the surgical threshold for aneurysm replacement although if I was going to do open surgical aortic valve replacement I would give strong consideration to replacing his aorta at the same time since it is about 2 times the size of his distal descending aorta.  I reviewed the CTA images with him and his wife and answered all their questions.  He would prefer proceeding with TAVR.  Following the decision to proceed with transcatheter aortic valve replacement, a discussion was held regarding what types of management strategies would be attempted intraoperatively in the event of life-threatening complications, including whether or not the patient would be considered a candidate for the use of cardiopulmonary bypass and/or  conversion to open sternotomy for attempted surgical intervention.  He is certainly a candidate for emergent sternotomy if necessary to manage any intraoperative complications.  The patient has been advised of a variety of complications that might develop including but not limited to risks of death, stroke, paravalvular leak, aortic dissection or other major vascular complications, aortic annulus rupture, device embolization, cardiac rupture or perforation, mitral regurgitation, acute myocardial infarction, arrhythmia, heart block or bradycardia requiring permanent pacemaker placement, congestive heart failure, respiratory failure, renal failure, pneumonia, infection, other late complications related to structural valve deterioration or migration, or other complications that might  ultimately cause a temporary or permanent loss of functional independence or other long term morbidity.  His electrocardiogram shows incomplete right bundle branch block and I think he would be at increased risk for needing a permanent pacemaker following either transcatheter aortic valve replacement or open surgical aortic valve replacement.  I discussed this with him.  The patient provides full informed consent for the procedure as described and all questions were answered.   Plan:  He is going to return to [place] tomorrow and then will return for transcatheter aortic valve replacement.  I spent 45 minutes performing this consultation and > 50% of this time was spent face to face counseling and coordinating the care of this patient's severe symptomatic aortic stenosis.   Gaye Pollack, MD 07/20/2018 5:09 PM

## 2018-07-20 NOTE — Progress Notes (Signed)
D/c instructions reviewed with Sean Abbott , wife, via telephone due to [place] restrictions.  All questions answered and Sean Abbott verbalized understanding

## 2018-07-21 ENCOUNTER — Other Ambulatory Visit: Payer: Self-pay

## 2018-07-21 ENCOUNTER — Ambulatory Visit: Payer: Medicare Other | Attending: Cardiovascular Disease | Admitting: Physical Therapy

## 2018-07-21 ENCOUNTER — Encounter: Payer: Self-pay | Admitting: Physical Therapy

## 2018-07-21 ENCOUNTER — Ambulatory Visit (HOSPITAL_COMMUNITY)
Admission: RE | Admit: 2018-07-21 | Discharge: 2018-07-21 | Disposition: A | Discharge status: Home / Self Care | Payer: Medicare Other | Source: Ambulatory Visit | Attending: Cardiovascular Disease | Admitting: Cardiovascular Disease

## 2018-07-21 DIAGNOSIS — I712 Thoracic aortic aneurysm, without rupture, unspecified: Secondary | ICD-10-CM

## 2018-07-21 DIAGNOSIS — R262 Difficulty in walking, not elsewhere classified: Secondary | ICD-10-CM | POA: Diagnosis not present

## 2018-07-21 DIAGNOSIS — I350 Nonrheumatic aortic (valve) stenosis: Secondary | ICD-10-CM | POA: Diagnosis present

## 2018-07-21 NOTE — Therapy (Signed)
[place], [place], [address] Phone: [phone]   Fax:  [phone]  Physical Therapy Evaluation/Pre-TAVR  Patient Details  Name: Sean Abbott MRN: [id number] Date of Birth: 10-03-1945 Referring Provider (PT): Sherren Mocha, MD   Encounter Date: 07/21/2018  PT End of Session - 07/21/18 1012    Visit Number  1    PT Start Time  0934    PT Stop Time  1000    PT Time Calculation (min)  26 min    Activity Tolerance  Patient tolerated treatment well    Behavior During Therapy  [facility] for tasks assessed/performed       Past Medical History:  Diagnosis Date  . Aortic valve disorder    2D ECHO, 12/05/2011 - EF >55%, moderate calcification of the aortic valve leaflets, moderate valvular aortic stenosis  . Chest pain, atypical    NUCLEAR STRESS TEST, 11/14/2005 - ECG positive for ischemia  . Claudication ([place])    LEA DUPLEX SCAN, 01/23/2009 - normal, no evidence of arterial insufficiency  . Hyperlipidemia   . Hypertension   . Skin cancer   . Skin disorder   . Thoracic aortic aneurysm [facility])     Past Surgical History:  Procedure Laterality Date  . CARDIAC CATHETERIZATION  12/05/2005   Normal coronary arteries, rule out thoracic aortic aneurysm  . MOUTH SURGERY  2003  . RIGHT HEART CATH AND CORONARY ANGIOGRAPHY N/A 07/20/2018   Procedure: RIGHT HEART CATH AND CORONARY ANGIOGRAPHY;  Surgeon: Lorretta Harp, MD;  Location: [facility];  Service: Cardiovascular;  Laterality: N/A;  . TENDON REPAIR Left 2005   Elbow    There were no vitals filed for this visit.   Subjective Assessment - 07/21/18 0937    Subjective  SOB, fatigue that began about a year ago. Pt lives in [place]. Typically did 6.5 mi on bike and 1 mi on eliptical with other strengthening exercises. Was told by cardiologist to cut this in half. Occasional muscle cramping.     Currently in Pain?  No/denies         [facility] PT Assessment  - 07/21/18 0001      Assessment   Medical Diagnosis  Severe aortic stenosis    Referring Provider (PT)  Sherren Mocha, MD    Hand Dominance  Right      Precautions   Precautions  None      Restrictions   Weight Bearing Restrictions  No      Balance Screen   Has the patient fallen in the past 6 months  No      [place] residence    Living Arrangements  Spouse/significant other    Additional Comments  no stairs at home      Prior Function   Level of [place]  Retired    Nature conservation officer, Public librarian   Overall Cognitive Status  Within Functional Limits for tasks assessed      Sensation   Additional Comments  [facility]      Posture/Postural Control   Posture Comments  Rounded shoulders      ROM / Strength   AROM / PROM / Strength  AROM;Strength      AROM   Overall AROM Comments  WFL      Strength   Overall Strength Comments  gross 5/5    Strength Assessment Site  Hand    Right/Left hand  Right;Left    Right Hand Grip (lbs)  --   not appropriate to test due to recent cath.    Left Hand Grip (lbs)  100       [facility] Pre-Surgical Assessment - 07/21/18 0001    5 Meter Walk Test- trial 1  2 sec    5 Meter Walk Test- trial 2  3 sec.     5 Meter Walk Test- trial 3  3 sec.    5 meter walk test average  2.67 sec    4 Stage Balance Test Position  4    comment  bil SLS 10s    Sit To Stand Test- trial 1  9 sec.    Comment  SOB following     6 Minute Walk- Baseline  yes    BP (mmHg)  156/83    HR (bpm)  64    02 Sat (%RA)  98 %    Modified Borg Scale for Dyspnea  0- Nothing at all    Perceived Rate of Exertion (Borg)  6-    6 Minute Walk Post Test  yes    BP (mmHg)  156/84    HR (bpm)  75    02 Sat (%RA)  99 %    Modified Borg Scale for Dyspnea  2- Mild shortness of breath    Perceived Rate of Exertion (Borg)  11- Fairly light    Aerobic Endurance Distance Walked  1490    Endurance  additional comments  13% disability compared to age-related norm              Objective measurements completed on examination: See above findings.                           Plan - 07/21/18 1012    PT Frequency  --   one-time Pre-TAVR evaluation   Consulted and Agree with Plan of Care  Patient       Clinical Impression Statement: Pt is a 73 yo M presenting to OP PT for evaluation prior to possible TAVR surgery due to severe aortic stenosis. Pt reports onset of SOB about 1 year ago. Symptoms are  limiting exercise routine and walking. Pt presents with WFL ROM and strength and denies musculoskeletal pain.  Pt ambulated a total of 1490 feet in 6 minute walk and reported 2/10 SOB on modified scale for dyspena and 11/20 RPE on Borg's perceived exertion and pain scale at the end of the walk. During the 6 minute walk test, patient's HR increased to 101 BPM and O2 saturation decreased to 93%. Based on the Short Physical Performance Battery, patient has a frailty rating of 12/12 with </= 5/12 considered frail.  Visit Diagnosis: Difficulty in walking, not elsewhere classified     Problem List Patient Active Problem List   Diagnosis Date Noted  . Thoracic aortic aneurysm ([place]) 09/10/2012  . Aortic stenosis 09/10/2012  . Hyperlipidemia 09/10/2012    Katalyn Matin C. Igor Bishop PT, DPT 07/21/18 10:13 AM   [facility] [address] [place], [place], [address] Phone: [phone]   Fax:  [phone]  Name: ZACCAI CHAVARIN MRN: [id number] Date of Birth: 06-27-1945

## 2018-07-21 NOTE — Progress Notes (Signed)
Carotid duplex has been completed.   Preliminary results in CV Proc.   Abram Sander 07/21/2018 8:15 AM

## 2018-07-26 ENCOUNTER — Other Ambulatory Visit: Payer: Self-pay

## 2018-07-26 DIAGNOSIS — I350 Nonrheumatic aortic (valve) stenosis: Secondary | ICD-10-CM

## 2018-07-29 ENCOUNTER — Other Ambulatory Visit: Payer: Self-pay

## 2018-07-29 DIAGNOSIS — I350 Nonrheumatic aortic (valve) stenosis: Secondary | ICD-10-CM

## 2018-07-30 NOTE — Pre-Procedure Instructions (Signed)
[id number] [place] [address] Phone: [phone] Fax: [phone]      Your procedure is scheduled on 08-04-18 Tuesday from  [id number]  Report to [facility] Main Entrance "A" at  [place].M., and check in at the Admitting office.  Call this number if you have problems the morning of surgery:  [phone]  Call [phone] if you have any questions prior to your surgery date Monday-Friday 8am-4pm    Remember:  Do not eat or drink after midnight.   Take these medicines the morning of surgery with A SIP OF WATER :none  Follow your surgeon's instructions on when to stop Aspirin.  If no instructions were given by your surgeon then you will need to call the office to get those instructions.    As of today STOP taking any  Aleve, Naproxen, Ibuprofen, Motrin, Advil, Goody's, BC's, all herbal medications, fish oil, and all vitamins.    The Morning of Surgery  Do not wear jewelry, make-up or nail polish.  Do not wear lotions, powders, or perfumes/colognes, or deodorant  Do not shave 48 hours prior to surgery.  Men may shave face and neck.  Do not bring valuables to the hospital.  [facility] is not responsible for any belongings or valuables.  If you are a smoker, DO NOT Smoke 24 hours prior to surgery IF you wear a CPAP at night please bring your mask, tubing, and machine the morning of surgery   Remember that you must have someone to transport you home after your surgery, and remain with you for 24 hours if you are discharged the same day.   Contacts, glasses, hearing aids, dentures or bridgework may not be worn into surgery.    Leave your suitcase in the car.  After surgery it may be brought to your room.  For patients admitted to the hospital, discharge time will be determined by your treatment team.  Patients discharged the day of surgery will not be allowed to drive home.    Special instructions:   [facility]- Preparing For Surgery  Before surgery, you can play an important role. Because skin is not sterile, your skin needs to be as free of germs as possible. You can reduce the number of germs on your skin by washing with CHG (chlorahexidine gluconate) Soap before surgery.  CHG is an antiseptic cleaner which kills germs and bonds with the skin to continue killing germs even after washing.    Oral Hygiene is also important to reduce your risk of infection.  Remember - BRUSH YOUR TEETH THE MORNING OF SURGERY WITH YOUR REGULAR TOOTHPASTE  Please do not use if you have an allergy to CHG or antibacterial soaps. If your skin becomes reddened/irritated stop using the CHG.  Do not shave (including legs and underarms) for at least 48 hours prior to first CHG shower. It is OK to shave your face.  Please follow these instructions carefully.   1. Shower the NIGHT BEFORE SURGERY and the MORNING OF SURGERY with CHG Soap.   2. If you chose to wash your hair, wash your hair first as usual with your normal shampoo.  3. After you shampoo, rinse your hair and body thoroughly to remove the shampoo.  4. Use CHG as you would any other liquid soap. You can apply CHG directly to the skin and wash gently with a scrungie or a clean washcloth.   5. Apply the CHG Soap  to your body ONLY FROM THE NECK DOWN.  Do not use on open wounds or open sores. Avoid contact with your eyes, ears, mouth and genitals (private parts). Wash Face and genitals (private parts)  with your normal soap.   6. Wash thoroughly, paying special attention to the area where your surgery will be performed.  7. Thoroughly rinse your body with warm water from the neck down.  8. DO NOT shower/wash with your normal soap after using and rinsing off the CHG Soap.  9. Pat yourself dry with a CLEAN TOWEL.  10. Wear CLEAN PAJAMAS to bed the night before surgery, wear comfortable clothes the morning of surgery  11. Place CLEAN SHEETS on your bed the  night of your first shower and DO NOT SLEEP WITH PETS.    Day of Surgery:  Do not apply any deodorants/lotions.  Please wear clean clothes to the hospital/surgery center.   Remember to brush your teeth WITH YOUR REGULAR TOOTHPASTE.   Please read over the following fact sheets that you were given.

## 2018-07-31 ENCOUNTER — Other Ambulatory Visit (HOSPITAL_COMMUNITY)
Admission: RE | Admit: 2018-07-31 | Discharge: 2018-07-31 | Disposition: A | Discharge status: Home / Self Care | Payer: Medicare Other | Source: Ambulatory Visit | Attending: Cardiovascular Disease | Admitting: Cardiovascular Disease

## 2018-07-31 ENCOUNTER — Encounter (HOSPITAL_COMMUNITY)
Admission: RE | Admit: 2018-07-31 | Discharge: 2018-07-31 | Disposition: A | Discharge status: Home / Self Care | Payer: Medicare Other | Source: Ambulatory Visit | Attending: Cardiovascular Disease | Admitting: Cardiovascular Disease

## 2018-07-31 ENCOUNTER — Encounter (HOSPITAL_COMMUNITY): Payer: Self-pay

## 2018-07-31 ENCOUNTER — Ambulatory Visit (HOSPITAL_COMMUNITY)
Admission: RE | Admit: 2018-07-31 | Discharge: 2018-07-31 | Disposition: A | Discharge status: Home / Self Care | Payer: Medicare Other | Source: Ambulatory Visit | Attending: Cardiovascular Disease | Admitting: Cardiovascular Disease

## 2018-07-31 ENCOUNTER — Other Ambulatory Visit: Payer: Self-pay

## 2018-07-31 DIAGNOSIS — E785 Hyperlipidemia, unspecified: Secondary | ICD-10-CM | POA: Insufficient documentation

## 2018-07-31 DIAGNOSIS — I712 Thoracic aortic aneurysm, without rupture: Secondary | ICD-10-CM | POA: Insufficient documentation

## 2018-07-31 DIAGNOSIS — Z1159 Encounter for screening for other viral diseases: Secondary | ICD-10-CM | POA: Insufficient documentation

## 2018-07-31 DIAGNOSIS — I350 Nonrheumatic aortic (valve) stenosis: Secondary | ICD-10-CM

## 2018-07-31 DIAGNOSIS — Z01818 Encounter for other preprocedural examination: Secondary | ICD-10-CM | POA: Insufficient documentation

## 2018-07-31 DIAGNOSIS — I10 Essential (primary) hypertension: Secondary | ICD-10-CM | POA: Insufficient documentation

## 2018-07-31 LAB — CBC
HCT: 44.4 % (ref 39.0–52.0)
Hemoglobin: 15.3 g/dL (ref 13.0–17.0)
MCH: 31.6 pg (ref 26.0–34.0)
MCHC: 34.5 g/dL (ref 30.0–36.0)
MCV: 91.7 fL (ref 80.0–100.0)
Platelets: 167 10*3/uL (ref 150–400)
RBC: 4.84 MIL/uL (ref 4.22–5.81)
RDW: 12.3 % (ref 11.5–15.5)
WBC: 7.4 10*3/uL (ref 4.0–10.5)
nRBC: 0 % (ref 0.0–0.2)

## 2018-07-31 LAB — HEMOGLOBIN A1C
Hgb A1c MFr Bld: 5 % (ref 4.8–5.6)
Mean Plasma Glucose: 96.8 mg/dL

## 2018-07-31 LAB — BLOOD GAS, ARTERIAL
Acid-base deficit: 0.3 mmol/L (ref 0.0–2.0)
Bicarbonate: 23.6 mmol/L (ref 20.0–28.0)
Drawn by: 421801
FIO2: 21
O2 Saturation: 98.3 %
Patient temperature: 98.6
pCO2 arterial: 37.1 mmHg (ref 32.0–48.0)
pH, Arterial: 7.419 (ref 7.350–7.450)
pO2, Arterial: 108 mmHg (ref 83.0–108.0)

## 2018-07-31 LAB — COMPREHENSIVE METABOLIC PANEL
ALT: 7 U/L (ref 0–44)
AST: 18 U/L (ref 15–41)
Albumin: 4.3 g/dL (ref 3.5–5.0)
Alkaline Phosphatase: 55 U/L (ref 38–126)
Anion gap: 11 (ref 5–15)
BUN: 28 mg/dL — ABNORMAL HIGH (ref 8–23)
CO2: 20 mmol/L — ABNORMAL LOW (ref 22–32)
Calcium: 9.1 mg/dL (ref 8.9–10.3)
Chloride: 109 mmol/L (ref 98–111)
Creatinine, Ser: 1 mg/dL (ref 0.61–1.24)
GFR calc Af Amer: >60 mL/min (ref >60)
GFR calc non Af Amer: >60 mL/min (ref >60)
Glucose, Bld: 85 mg/dL (ref 70–99)
Potassium: 4.2 mmol/L (ref 3.5–5.1)
Sodium: 140 mmol/L (ref 135–145)
Total Bilirubin: 0.7 mg/dL (ref 0.3–1.2)
Total Protein: 7.3 g/dL (ref 6.5–8.1)

## 2018-07-31 LAB — URINALYSIS, ROUTINE W REFLEX MICROSCOPIC
Bilirubin Urine: NEGATIVE
Glucose, UA: NEGATIVE mg/dL
Hgb urine dipstick: NEGATIVE
Ketones, ur: NEGATIVE mg/dL
Leukocytes,Ua: NEGATIVE
Nitrite: NEGATIVE
Protein, ur: NEGATIVE mg/dL
Specific Gravity, Urine: 1.024 (ref 1.005–1.030)
pH: 7 (ref 5.0–8.0)

## 2018-07-31 LAB — BRAIN NATRIURETIC PEPTIDE: B Natriuretic Peptide: 60 pg/mL (ref 0.0–100.0)

## 2018-07-31 LAB — ABO/RH: ABO/RH(D): A POS

## 2018-07-31 LAB — APTT: aPTT: 29 seconds (ref 24–36)

## 2018-07-31 LAB — SURGICAL PCR SCREEN
MRSA, PCR: NEGATIVE
Staphylococcus aureus: POSITIVE — AB

## 2018-07-31 LAB — TYPE AND SCREEN
ABO/RH(D): A POS
Antibody Screen: NEGATIVE

## 2018-07-31 LAB — PROTIME-INR
INR: 1.1 (ref 0.8–1.2)
Prothrombin Time: 13.9 seconds (ref 11.4–15.2)

## 2018-07-31 NOTE — Progress Notes (Signed)
  Coronavirus Screening  Pt scheduled for COVID test today at [facility] Have you experienced the following symptoms:  Cough yes/no: No Fever (>100.61F)  yes/no: No Runny nose yes/no: No Sore throat yes/no: No Difficulty breathing/shortness of breath  yes/no: No Loss of sense of smell or taste-No Have you or a family member traveled in the last 14 days and where? yes/no: No  PCP - Dr. Carlyle Dolly ([facility]  Cardiologist - Dr. Apolinar Junes)   Dr. Mitchel Honour ([facility])  Chest x-ray - 07-31-18 (Epic)  EKG - 07-20-18(Epic)  Stress Test - 11-14-05(Epic)  ECHO - 10-17-139Epic)  Cardiac Cath - 12-05-05(Epic)  AICD-denies PM-denies LOOP-denies  Sleep Study - had it done yrs ago,which was negative.Does not recall location. CPAP - No  LABS-PCR,CBC,CMP,ABG,PT-INR,APTT,UA,A1C,BNP,T/S  ASA- Will follow up with surgeon's office in regards to when to stop Aspirin. Not on any anti-coagulants.  ERAS-NA  HA1C-denies Fasting Blood Sugar -  Checks Blood Sugar 0_____ times a day  Anesthesia-yes, Cardiac history. Pt c/o feeling SOB only when trying to exert himself. Pt denies having chest pain, palpitations,sob, or fever at this time. All instructions explained to the pt, with a verbal understanding of the material. Pt agrees to go over the instructions while at home for a better understanding. The opportunity to ask questions was provided.

## 2018-08-01 LAB — NOVEL CORONAVIRUS, NAA (HOSP ORDER, SEND-OUT TO REF LAB; TAT 18-24 HRS): SARS-CoV-2, NAA: NOT DETECTED

## 2018-08-03 ENCOUNTER — Encounter (HOSPITAL_COMMUNITY): Payer: Self-pay | Admitting: Registered Nurse

## 2018-08-03 MED ORDER — SODIUM CHLORIDE 0.9 % IV SOLN
INTRAVENOUS | Status: DC
  Filled 2018-08-03: qty 30

## 2018-08-03 MED ORDER — POTASSIUM CHLORIDE 2 MEQ/ML IV SOLN
80.0000 meq | INTRAVENOUS | Status: DC
  Filled 2018-08-03: qty 40

## 2018-08-03 MED ORDER — DEXMEDETOMIDINE HCL IN NACL 400 MCG/100ML IV SOLN
0.1000 ug/kg/h | INTRAVENOUS | Status: DC
  Filled 2018-08-03: qty 100

## 2018-08-03 MED ORDER — NOREPINEPHRINE 4 MG/250ML-% IV SOLN
0.0000 ug/min | INTRAVENOUS | Status: DC
  Filled 2018-08-03: qty 250

## 2018-08-03 MED ORDER — MAGNESIUM SULFATE 50 % IJ SOLN
40.0000 meq | INTRAMUSCULAR | Status: DC
  Filled 2018-08-03: qty 9.85

## 2018-08-03 MED ORDER — SODIUM CHLORIDE 0.9 % IV SOLN
1.5000 g | INTRAVENOUS | Status: AC
  Administered 2018-08-04: 1.5 g via INTRAVENOUS
  Filled 2018-08-03: qty 1.5

## 2018-08-03 MED ORDER — VANCOMYCIN HCL 10 G IV SOLR
1500.0000 mg | INTRAVENOUS | Status: AC
  Administered 2018-08-04: 1500 mg via INTRAVENOUS
  Filled 2018-08-03: qty 1500

## 2018-08-03 NOTE — H&P (Signed)
[address]       , [id number]             [phone]      Cardiothoracic Surgery Admission History and Physical   Referring Provider is Lorretta Harp, MD  Primary Cardiologist is No primary care provider on file.  PCP is Default, Provider, MD      Chief Complaint  Patient presents with   Aortic Stenosis      HPI:   The patient is a 73 year old active gentleman with history of hypertension, hyperlipidemia, bicuspid aortic valve disease with and known aortic stenosis. He was first diagnosed with a heart murmur in 2007 and has been followed by a cardiologist in [place] where he currently lives. He was noted to have progressive aortic stenosis on his most recent echocardiogram with a mean gradient of 49 mm across aortic valve. He is an active gentleman who plays golf 3 days/week and goes to the gym on the other days of the week. He has had progressive exertional fatigue and shortness of breath over the past year and only can walk about 9 holes playing golf for yes stop. He has had no chest pain or pressure. Has had no dizziness or syncope. Denies peripheral edema. He has had no orthopnea.  He lives in [place] with his wife. He retired from [employer] in 2008 and moved to [place] to be closer to his mother who passed away with Alzheimer's dementia in January 2020 at the age of 4. 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      Past Medical History:  Diagnosis Date   Aortic valve disorder    2D ECHO, 12/05/2011 - EF >55%, moderate calcification of the aortic valve leaflets, moderate valvular aortic stenosis   Chest pain, atypical    NUCLEAR STRESS TEST, 11/14/2005 - ECG positive for ischemia   Claudication ([place])    LEA DUPLEX SCAN, 01/23/2009 - normal, no evidence of arterial insufficiency   Hyperlipidemia    Hypertension    Skin cancer    Skin disorder    Thoracic aortic aneurysm [facility])         Past Surgical History:  Procedure  Laterality Date   CARDIAC CATHETERIZATION  12/05/2005   Normal coronary arteries, rule out thoracic aortic aneurysm   MOUTH SURGERY  2003   RIGHT HEART CATH AND CORONARY ANGIOGRAPHY N/A 07/20/2018   Procedure: RIGHT HEART CATH AND CORONARY ANGIOGRAPHY; Surgeon: Lorretta Harp, MD; Location: [facility]; Service: Cardiovascular; Laterality: N/A;   TENDON REPAIR Left 2005   Elbow        Family History  Problem Relation Age of Onset   Heart disease Father    Diabetes Father    Hypertension Father    Social History        Socioeconomic History   Marital status: Married    Spouse name: Not on file   Number of children: Not on file   Years of education: Not on file   Highest education level: Not on file  Occupational History   Not on file  Social Needs   Financial resource strain: Not on file   Food insecurity:    Worry: Not on file    Inability: Not on file   Transportation needs:    Medical: Not on file    Non-medical: Not on file  Tobacco Use   Smoking status: Never Smoker   Smokeless tobacco: Never Used  Substance  and Sexual Activity   Alcohol use: Yes   Drug use: Not on file   Sexual activity: Not on file  Lifestyle   Physical activity:    Days per week: Not on file    Minutes per session: Not on file   Stress: Not on file  Relationships   Social connections:    Talks on phone: Not on file    Gets together: Not on file    Attends religious service: Not on file    Active member of club or organization: Not on file    Attends meetings of clubs or organizations: Not on file    Relationship status: Not on file   Intimate partner violence:    Fear of current or ex partner: Not on file    Emotionally abused: Not on file    Physically abused: Not on file    Forced sexual activity: Not on file  Other Topics Concern   Not on file  Social History Narrative   Not on file         Current Outpatient Medications  Medication Sig  Dispense Refill   aspirin EC 81 MG tablet Take 81 mg by mouth at bedtime.      CRESTOR 10 MG tablet TAKE 1 TABLET BY MOUTH EVERY OTHER DAY (Patient taking differently: Take 10 mg by mouth every other day. IN THE EVENING.) 30 tablet 3   Glucosamine-Chondroitin (COSAMIN DS PO) Take 1 tablet by mouth daily.     Multiple Vitamin (MULTIVITAMIN WITH MINERALS) TABS tablet Take 1 tablet by mouth daily.     naproxen sodium (ALEVE) 220 MG tablet Take 440 mg by mouth 2 (two) times daily as needed (pain.).     No current facility-administered medications for this visit.         Allergies  Allergen Reactions   Demerol [Meperidine] Other (See Comments)    HOT/AGITATED   Lipitor [Atorvastatin] Other (See Comments)    Myalgias    Review of Systems:   General: normal appetite, + decreased energy, no weight gain, no weight loss, no fever  Cardiac: no chest pain with exertion, no chest pain at rest, +SOB with exertion, no resting SOB, no PND, no orthopnea, no palpitations, no arrhythmia, no atrial fibrillation, no LE edema, no dizzy spells, no syncope  Respiratory: + exertional shortness of breath, no home oxygen, no productive cough, no dry cough, no bronchitis, no wheezing, no hemoptysis, no asthma, no pain with inspiration or cough, no sleep apnea, no CPAP at night  GI: no difficulty swallowing, no reflux, no frequent heartburn, no hiatal hernia, no abdominal pain, no constipation, no diarrhea, no hematochezia, no hematemesis, no melena  GU: no dysuria, no frequency, no urinary tract infection, no hematuria, no enlarged prostate, no kidney stones, no kidney disease  Vascular: no pain suggestive of claudication, no pain in feet, no leg cramps, no varicose veins, no DVT, no non-healing foot ulcer  Neuro: no stroke, no TIA's, no seizures, no headaches, no temporary blindness one eye, no slurred speech, no peripheral neuropathy, no chronic pain, no instability of gait, no memory/cognitive dysfunction    Musculoskeletal: no arthritis, no joint swelling, no myalgias, no difficulty walking, normal mobility  Skin: no rash, no itching, no skin infections, no pressure sores or ulcerations  Psych: no anxiety, no depression, no nervousness, no unusual recent stress  Eyes: no blurry vision, no floaters, no recent vision changes, does not wear glasses or contacts  ENT: no hearing loss, no loose  or painful teeth, no dentures, last saw dentist 2019  Hematologic: no easy bruising, no abnormal bleeding, no clotting disorder, no frequent epistaxis  Endocrine: no diabetes, does not check CBG's at home    Physical Exam:   BP (!) 155/88   Pulse 70   Temp (!) 96.7 F (35.9 C) (Skin)   Resp 20   Ht 5\' 10"  (1.778 m)   Wt 84.8 kg   SpO2 97% Comment: RA   BMI 26.83 kg/m  General: Fit and well-appearing  HEENT: Unremarkable, NCAT, PERLA, EOMI  Neck: no JVD, no bruits, no adenopathy or thyromegaly  Chest: clear to auscultation, symmetrical breath sounds, no wheezes, no rhonchi  CV: RRR, grade III/VI crescendo/decrescendo murmur heard best at RSB, no diastolic murmur  Abdomen: soft, non-tender, no masses  Extremities: warm, well-perfused, pulses palpable, no LE edema  Rectal/GU Deferred  Neuro: Grossly non-focal and symmetrical throughout  Skin: Clean and dry, no rashes, no breakdown    Diagnostic Tests:   Most recent echo from 02/26/2018 done in [facility] was reviewed. Aortic valve mean gradient is 49 mmHg. Aortic valve area 0.8 cm. Left ventricular ejection fraction 65%. A sending aortic diameter was measured at 4.1 cm.    Physicians  Panel Physicians Referring Physician Case Authorizing Physician  Lorretta Harp, MD (Primary)    Procedures  RIGHT HEART CATH AND CORONARY ANGIOGRAPHY  Conclusion  Hemodynamic findings consistent with aortic valve stenosis. FAOLAN SPRINGFIELD is a 73 y.o. male  [id number]  LOCATION: FACILITY: [facility]  PHYSICIAN: Quay Burow, M.D.  02/23/1945  DATE OF PROCEDURE:  07/20/2018  DATE OF DISCHARGE:   CARDIAC CATHETERIZATION  History obtained from chart review.Mr Moultrie is a delightful 27 -year-old, thin and fit-appearing, married Caucasian male, father of 2, grandfather to 3 grandchildren who I last saw in the office 09/10/2012. He is accompanied by his wife today. He has a history of normal coronary arteries by cath which I performed December 05, 2005. He did have a generous thoracic aorta at that time and a CT showed his thoracic aorta measuring 4 cm as well as mild to moderate aortic stenosis. His other problems include mild hyperlipidemia in Crestor. He denies chest pain or shortness of breath but does complain of just some generalized fatigue, though he does exercise several times a week. 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 He had an echo performed 10/19/2013 revealing normal LV function, valve area 1.2 cm with a peak aortic gradient of 54 mmHg.  He moved down to [facility] to be closer to his mom who had Alzheimer's. She apparently passed away this past 12-Apr-2022. He is currently retired. He plays golf 4 days a week. He notices that on the back 9 he has increasing dyspnea on exertion over the last couple years which is more noticeable. He did see a cardiologist down there and had an echo performed 02/26/2018 revealing normal LV function with a peak gradient of 84 mmHg and a valve area 0.82 cm. He clearly is to the point where he needs valve replacement. He may be a candidate for TAVR. I referred him to Dr. Burt Knack who did a virtual video TAVR consult. The patient drove up from [facility] last week for right and left heart cath, CTA and TAVR evaluation.  IMPRESSION: Mr. Jarrard has a left dominant system with normal coronary  arteries. Unfortunately, I was unable to visualize the nondominant RCA but this was normal at the  time of his last cardiac catheterization which I performed 12/05/2005. He had excellent cardiac output. He is scheduled for TAVR CTA evaluation. Does have mild to moderate thoracic aortic root dilatation. Dr. Burt Knack is aware and will see the patient prior to him leaving the hospital. The sheaths were removed and a TR band was placed on the right wrist to achieve patent hemostasis. The patient left lab in stable condition.  Quay Burow. MD, [facility]  07/20/2018  8:19 AM   Recommendations  Antiplatelet/Anticoag Recommend Aspirin 81mg  daily for moderate CAD.  Indications  Severe aortic stenosis [I35.0 (ICD-10-CM)]  Procedural Details  Technical Details PROCEDURE DESCRIPTION:   The patient was brought to the second floor [facility] cath lab in the postabsorptive state. He was not premedicated . His right wrist and antecubital fossaWere prepped and shaved in usual sterile fashion. Xylocaine 1% was used for local anesthesia. A 6 French sheath was inserted into the right radial artery using standard Seldinger technique. A 5 French sheath was inserted into the right antecubital vein. A 5 French balloontipped Swan-Ganz catheter was then advanced through the right heart chambers obtaining sequential pressures, and pulmonary artery blood sample for the determination of Fick cardiac output. A 5 [place] TIG catheter, right Judkins and no torque catheters were used for selective coronary angiography. The aortic valve was heavily calcified fluoroscopically. I was never able to identify the nondominant right coronary artery. Isovue dye was used for the entirety of the case. Retrograde aortic pressure was monitored on the case. The patient received radial cocktail via the SideArm sheath. The patient received  4500 units of heparin intravenously.   Estimated blood loss <50 mL.   During this procedure no sedation was administered.  Medications  (Filter: Administrations occurring from 07/20/18 0715 to 07/20/18  0820)          Medication Rate/Dose/Volume Action  Date Time   Heparin (Porcine) in NaCl 1000-0.9 UT/500ML-% SOLN (mL) 500 mL Given 07/20/18 0732   Total dose as of 07/20/18 1720 500 mL Given 0732   1,000 mL        lidocaine (PF) (XYLOCAINE) 1 % injection (mL) 2 mL Given 07/20/18 0741   Total dose as of 07/20/18 1720 2 mL Given 0747   4 mL        Radial Cocktail (Verapamil 5 mg, NTG, Lidocaine) (mL) 10 mL Given 07/20/18 0749   Total dose as of 07/20/18 1720        10 mL        heparin injection (Units) 4,500 Units Given 07/20/18 0753   Total dose as of 07/20/18 1720        4,500 Units        iohexol (OMNIPAQUE) 350 MG/ML injection (mL) 125 mL Given 07/20/18 0809   Total dose as of 07/20/18 1720        125 mL        Coronary Findings  Diagnostic  Dominance: Left  No diagnostic findings have been documented.  Intervention  No interventions have been documented.  Right Heart  Right Heart Pressures Hemodynamic findings consistent with aortic valve stenosis. Right atrial pressure- 7/5 Right ventricular pressure-33/0 Pulmonary artery pressure- 32/11, mean 21 Pulmonary wedge pressure-V wave 17, V wave 18, mean 12 Cardiac output- 9.27 L/min with an index of 4.57 L/min/m  Coronary Diagrams  Diagnostic  Dominance: Left   Intervention  Implants  No implant documentation for this case.  Syngo Images  Link to Procedure Log   Show images for CARDIAC CATHETERIZATION Procedure Log  Images on Long Term Storage    Show images for Apollo, Timothy   Hemo Data   Most Recent Value  Fick Cardiac Output 9.27 L/min  Fick Cardiac Output Index 4.57 (L/min)/BSA  RA A Wave 7 mmHg  RA V Wave 5 mmHg  RA Mean 3 mmHg  RV Systolic Pressure 33 mmHg  RV Diastolic Pressure 0 mmHg  RV EDP 6 mmHg  PA Systolic Pressure 32 mmHg  PA Diastolic Pressure 11 mmHg  PA Mean 21 mmHg  PW A Wave 17 mmHg  PW V Wave 18 mmHg  PW Mean 12 mmHg  AO Systolic Pressure 151 mmHg  AO Diastolic Pressure 72 mmHg   AO Mean 97 mmHg  QP/QS 1  TPVR Index 4.6 HRUI  TSVR Index 21.25 HRUI  PVR SVR Ratio 0.1  TPVR/TSVR Ratio 0.22     ADDENDUM REPORT: 07/17/2018 12:52  EXAM:  OVER-READ INTERPRETATION CT CHEST  The following report is an over-read performed by radiologist Dr.  Rebekah [facility] on 07/17/2018. This  over-read does not include interpretation of cardiac or coronary  anatomy or pathology. The cardiac CTA interpretation by the  cardiologist is attached.  COMPARISON: Chest CT 11/24/2012.  FINDINGS:  Extracardiac findings will be described separately under dictation  for contemporaneously obtained CTA chest, abdomen and pelvis.  IMPRESSION:  Please see separate dictation for contemporaneously obtained CTA  chest, abdomen and pelvis 07/17/2018 for full description of  relevant extracardiac findings.  Electronically Signed  By: Vinnie Langton M.D.  On: 07/17/2018 12:52   Addended by Etheleen Mayhew, MD on 07/17/2018 12:55 PM  Study Result   CLINICAL DATA: Aortic Stenosis  EXAM:  Cardiac TAVR CT  TECHNIQUE:  The patient was scanned on a Siemens Force 761 slice scanner. A 120  kV retrospective scan was triggered in the ascending thoracic aorta  at 140 HU's. Gantry rotation speed was 250 msecs and collimation was  .6 mm. No beta blockade or nitro were given. The 3D data set was  reconstructed in 5% intervals of the R-R cycle. Systolic and  diastolic phases were analyzed on a dedicated work station using  MPR, MIP and VRT modes. The patient received 80 cc of contrast.  FINDINGS:  Aortic Valve: Bicuspid and calcified with raphe between the right  and left cusps  Aorta: Mild atherosclerosis with ascending root aneurysm and bovine  arch  Sino-tubular Junction: 32 mm  Ascending Thoracic Aorta: 45 mm  Aortic Arch: 23 mm  Descending Thoracic Aorta: 22 mm  Sinus of Valsalva Measurements:  Commissural Sinus: 32.6 mm  Non Commissural Sinus: 37.3 mm  Coronary  Artery Height above Annulus:  Left Main: 17.5 mm above annulus  Right Coronary: 17 mm above annulus  Virtual Basal Annulus Measurements:  Maximum / Minimum Diameter: 30 mm x 25.9 mm  Perimeter: 90 mm  Area: 598 mm2  Coronary Arteries: Sufficient height above annulus for deployment  Optimum Fluoroscopic Angle for Delivery: LAO 16 Caudal 15 degrees  IMPRESSION:  1. Bicuspid AV with annular area of 598 mm2 suitable for a 29 mm  Sapien 3 valve  2. 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 Coronary arteries sufficient height above annulus for deployment  4. No LAA thrombus  5. Optimum angiographic angle for deployment LAO 16 Caudal 15  degrees  Jenkins Rouge  Electronically Signed:  By: Jenkins Rouge M.D.  On: 07/17/2018 11:34    CLINICAL DATA: 73 year old male with history of severe aortic  stenosis. Preprocedural study prior to potential transcatheter  aortic valve replacement (TAVR) procedure.  EXAM:  CT ANGIOGRAPHY CHEST, ABDOMEN AND PELVIS  TECHNIQUE:  Multidetector CT imaging through the chest, abdomen and pelvis was  performed using the standard protocol during bolus administration of  intravenous contrast. Multiplanar reconstructed images and MIPs were  obtained and reviewed to evaluate the vascular anatomy.  CONTRAST: 156mL OMNIPAQUE IOHEXOL 350 MG/ML SOLN  COMPARISON: Chest CTA 11/24/2012. CT the abdomen and pelvis  12/26/2008.  FINDINGS:  CTA CHEST FINDINGS  Cardiovascular: Heart size is normal. There is no significant  pericardial fluid, thickening or pericardial calcification. There is  aortic atherosclerosis, as well as atherosclerosis of the great  vessels of the mediastinum and the coronary arteries, including  calcified atherosclerotic plaque in the left anterior descending  coronary artery. Ectasia of the ascending thoracic aorta (4.1 cm in  diameter).  Mediastinum/Lymph Nodes: No pathologically enlarged mediastinal or  hilar lymph nodes. Esophagus is  unremarkable in appearance. No  axillary lymphadenopathy.  Lungs/Pleura: No suspicious appearing pulmonary nodules or masses  are noted. No acute consolidative airspace disease. No pleural  effusions.  Musculoskeletal/Soft Tissues: There are no aggressive appearing  lytic or blastic lesions noted in the visualized portions of the  skeleton.  CTA ABDOMEN AND PELVIS FINDINGS  Hepatobiliary: No suspicious cystic or solid hepatic lesions. No  intra or extrahepatic biliary ductal dilatation. Gallbladder is  normal in appearance.  Pancreas: No pancreatic mass. No pancreatic ductal dilatation. No  pancreatic or peripancreatic fluid or inflammatory changes.  Spleen: Unremarkable.  Adrenals/Urinary Tract: Bilateral kidneys and bilateral adrenal  glands are normal in appearance. No hydroureteronephrosis. Urinary  bladder is normal in appearance.  Stomach/Bowel: Normal appearance of the stomach. No pathologic  dilatation of small bowel or colon. Numerous colonic diverticulae  are noted, without surrounding inflammatory changes to suggest an  acute diverticulitis at this time. Normal appendix.  Vascular/Lymphatic: Aortic atherosclerosis, with vascular findings  and measurements pertinent to potential TAVR procedure, as detailed  below. No aneurysm or dissection noted in the abdominal or pelvic  vasculature. No lymphadenopathy noted in the abdomen or pelvis.  Reproductive: Prostate gland and seminal vesicles are unremarkable  in appearance.  Other: No significant volume of ascites. No pneumoperitoneum.  Musculoskeletal: There are no aggressive appearing lytic or blastic  lesions noted in the visualized portions of the skeleton.  VASCULAR MEASUREMENTS PERTINENT TO TAVR:  AORTA:  Minimal Aortic Diameter-1.5 x 1.2 mm  Severity of Aortic Calcification-mild  RIGHT PELVIS:  Right Common Iliac Artery -  Minimal Diameter-8.6 x 9.0 mm  Tortuosity-mild  Calcification-none  Right External Iliac  Artery -  Minimal Diameter-8.3 x 8.4 mm  Tortuosity-severe  Calcification-minimal  Right Common Femoral Artery -  Minimal Diameter-8.4 x 6.6 mm  Tortuosity-mild  Calcification-mild  LEFT PELVIS:  Left Common Iliac Artery -  Minimal Diameter-9.7 x 10.0 mm  Tortuosity-mild  Calcification-minimal  Left External Iliac Artery -  Minimal Diameter-8.6 x 8.0 mm  Tortuosity-severe  Calcification-none  Left Common Femoral Artery -  Minimal Diameter-8.5 x 7.2 mm  Tortuosity-mild  Calcification-none  Review of the MIP images confirms the above findings.  IMPRESSION:  1. Vascular findings and measurements pertinent to potential TAVR  procedure, as detailed above.  2. Severe thickening calcification of the aortic valve, compatible  with the reported clinical history of severe aortic stenosis.  3. Aortic  atherosclerosis, in addition to left anterior descending  coronary artery disease. In addition, there is ectasia of the  ascending thoracic aorta (4.1 cm in diameter). Recommend annual  imaging followup by CTA or MRA. This recommendation follows 2010  ACCF/AHA/AATS/ACR/ASA/SCA/SCAI/SIR/STS/SVM Guidelines for the  Diagnosis and Management of Patients with Thoracic Aortic Disease.  Circulation. 2010; 121: [id number]. Aortic aneurysm NOS  (ICD10-I71.9).  4. Colonic diverticulosis without evidence of acute diverticulitis  at this time.  5. Additional incidental findings, as above.  Electronically Signed  By: Vinnie Langton M.D.  On: 07/17/2018 14:31    STS Risk Calculator - Isolated AVR:   Risk of Mortality:  0.867%  Renal Failure:  1.554%  Permanent Stroke:  1.242%  Prolonged Ventilation:  3.116%  DSW Infection:  0.079%  Reoperation:  3.680%  Morbidity or Mortality:  6.724%  Short Length of Stay:  55.358%  Long Length of Stay:  2.470%    Impression:   This 73 year old gentleman has stage D, severe, symptomatic aortic stenosis with New York Heart Association class  II-III symptoms of exertional fatigue and shortness of breath consistent with chronic diastolic congestive heart failure. I have personally reviewed his 2D echocardiogram, cardiac catheterization, and CTA studies. His echocardiogram shows a bicuspid aortic valve with a raphae between the right and left coronary cusp. The valve leaflets are severely calcified with reduced mobility. The mean gradient across the aortic valve was 49 mmHg consistent with severe aortic stenosis. Left ventricular ejection fraction is normal. Cardiac catheterization shows no significant coronary disease. His CTA of the chest shows a 4.1 cm fusiform ascending aortic aneurysm which is unchanged compared to prior CT scans dating back to 12/12/2005 when it measured 4.0 cm. The distal descending thoracic aorta above the diaphragm measures 2.1 cm. I agree that aortic valve replacement is indicated in this patient with progressive symptoms of severe aortic stenosis to prevent progressive left ventricular deterioration and to improve his quality of life.  The patient was counseled at length regarding treatment alternatives for management of severe symptomatic aortic stenosis. The risks and benefits of surgical intervention has been discussed in detail. Long-term prognosis with medical therapy was discussed. Alternative approaches such as conventional surgical aortic valve replacement, transcatheter aortic valve replacement, and palliative medical therapy were compared and contrasted at length. This discussion was placed in the context of the patient's own specific clinical presentation and past medical history. All of their questions been addressed. He is very active playing golf and working out and would prefer being treated with TAVR rather than open surgical aortic valve placement. He is a low risk surgical patient but given his age of 30 years I think TAVR is a reasonable alternative. He does have an ascending aortic aneurysm measuring 4.1 cm  but this is been essentially unchanged dating back to 2007. This is still well below the surgical threshold for aneurysm replacement although if I was going to do open surgical aortic valve replacement I would give strong consideration to replacing his aorta at the same time since it is about 2 times the size of his distal descending aorta. I reviewed the CTA images with him and his wife and answered all their questions. He would prefer proceeding with TAVR.  Following the decision to proceed with transcatheter aortic valve replacement, a discussion was held regarding what types of management strategies would be attempted intraoperatively in the event of life-threatening complications, including whether or not the patient would be considered a candidate for the use of cardiopulmonary bypass and/or  conversion to open sternotomy for attempted surgical intervention. He is certainly a candidate for emergent sternotomy if necessary to manage any intraoperative complications.  The patient has been advised of a variety of complications that might develop including but not limited to risks of death, stroke, paravalvular leak, aortic dissection or other major vascular complications, aortic annulus rupture, device embolization, cardiac rupture or perforation, mitral regurgitation, acute myocardial infarction, arrhythmia, heart block or bradycardia requiring permanent pacemaker placement, congestive heart failure, respiratory failure, renal failure, pneumonia, infection, other late complications related to structural valve deterioration or migration, or other complications that might ultimately cause a temporary or permanent loss of functional independence or other long term morbidity. His electrocardiogram shows incomplete right bundle branch block and I think he would be at increased risk for needing a permanent pacemaker following either transcatheter aortic valve replacement or open surgical aortic valve replacement. I  discussed this with him. The patient provides full informed consent for the procedure as described and all questions were answered.    Plan:   Transfemoral transcatheter aortic valve replacement.    Gaye Pollack, MD

## 2018-08-03 NOTE — Anesthesia Preprocedure Evaluation (Addendum)
Anesthesia Evaluation  Patient identified by MRN, date of birth, ID band Patient awake    Reviewed: Allergy & Precautions, NPO status , Patient's Chart, lab work & pertinent test results  Airway Mallampati: II  TM Distance: >3 FB     Dental  (+) Dental Advisory Given   Pulmonary neg pulmonary ROS,    breath sounds clear to auscultation       Cardiovascular hypertension, + Valvular Problems/Murmurs AS  Rhythm:Regular Rate:Normal     Neuro/Psych negative neurological ROS     GI/Hepatic negative GI ROS, Neg liver ROS,   Endo/Other  negative endocrine ROS  Renal/GU negative Renal ROS     Musculoskeletal   Abdominal   Peds  Hematology negative hematology ROS (+)   Anesthesia Other Findings   Reproductive/Obstetrics                            Lab Results  Component Value Date   WBC 7.4 07/31/2018   HGB 15.3 07/31/2018   HCT 44.4 07/31/2018   MCV 91.7 07/31/2018   PLT 167 07/31/2018   Lab Results  Component Value Date   CREATININE 1.00 07/31/2018   BUN 28 (H) 07/31/2018   NA 140 07/31/2018   K 4.2 07/31/2018   CL 109 07/31/2018   CO2 20 (L) 07/31/2018    Anesthesia Physical Anesthesia Plan  ASA: IV  Anesthesia Plan: MAC   Post-op Pain Management:    Induction:   PONV Risk Score and Plan: 1 and Ondansetron, Dexamethasone and Treatment may vary due to age or medical condition  Airway Management Planned: Natural Airway and Simple Face Mask  Additional Equipment: Arterial line  Intra-op Plan:   Post-operative Plan:   Informed Consent: I have reviewed the patients History and Physical, chart, labs and discussed the procedure including the risks, benefits and alternatives for the proposed anesthesia with the patient or authorized representative who has indicated his/her understanding and acceptance.     Dental advisory given  Plan Discussed with: CRNA  Anesthesia Plan  Comments:        Anesthesia Quick Evaluation

## 2018-08-04 ENCOUNTER — Other Ambulatory Visit: Payer: Self-pay

## 2018-08-04 ENCOUNTER — Inpatient Hospital Stay (HOSPITAL_COMMUNITY): Payer: Medicare Other

## 2018-08-04 ENCOUNTER — Inpatient Hospital Stay (HOSPITAL_COMMUNITY): Payer: Medicare Other | Admitting: Vascular Surgery

## 2018-08-04 ENCOUNTER — Other Ambulatory Visit: Payer: Self-pay | Admitting: Physician Assistant

## 2018-08-04 ENCOUNTER — Inpatient Hospital Stay (HOSPITAL_COMMUNITY): Payer: Medicare Other | Admitting: Certified Registered"

## 2018-08-04 ENCOUNTER — Encounter (HOSPITAL_COMMUNITY): Payer: Self-pay

## 2018-08-04 ENCOUNTER — Encounter (HOSPITAL_COMMUNITY)
Admission: RE | Disposition: A | Discharge status: Home / Self Care | Payer: Self-pay | Source: Home / Self Care | Attending: Surgery

## 2018-08-04 ENCOUNTER — Inpatient Hospital Stay (HOSPITAL_COMMUNITY)
Admission: RE | Admit: 2018-08-04 | Discharge: 2018-08-05 | DRG: 267 | Disposition: A | Discharge status: Home / Self Care | Payer: Medicare Other | Attending: Surgery | Admitting: Surgery

## 2018-08-04 DIAGNOSIS — Z85828 Personal history of other malignant neoplasm of skin: Secondary | ICD-10-CM | POA: Diagnosis not present

## 2018-08-04 DIAGNOSIS — Z952 Presence of prosthetic heart valve: Secondary | ICD-10-CM

## 2018-08-04 DIAGNOSIS — I739 Peripheral vascular disease, unspecified: Secondary | ICD-10-CM | POA: Diagnosis not present

## 2018-08-04 DIAGNOSIS — Z885 Allergy status to narcotic agent status: Secondary | ICD-10-CM | POA: Diagnosis not present

## 2018-08-04 DIAGNOSIS — Q231 Congenital insufficiency of aortic valve: Secondary | ICD-10-CM | POA: Diagnosis not present

## 2018-08-04 DIAGNOSIS — I5032 Chronic diastolic (congestive) heart failure: Secondary | ICD-10-CM | POA: Diagnosis not present

## 2018-08-04 DIAGNOSIS — E782 Mixed hyperlipidemia: Secondary | ICD-10-CM | POA: Diagnosis present

## 2018-08-04 DIAGNOSIS — I4510 Unspecified right bundle-branch block: Secondary | ICD-10-CM | POA: Diagnosis not present

## 2018-08-04 DIAGNOSIS — I110 Hypertensive heart disease with heart failure: Secondary | ICD-10-CM | POA: Diagnosis present

## 2018-08-04 DIAGNOSIS — Z006 Encounter for examination for normal comparison and control in clinical research program: Secondary | ICD-10-CM

## 2018-08-04 DIAGNOSIS — Z7982 Long term (current) use of aspirin: Secondary | ICD-10-CM | POA: Diagnosis not present

## 2018-08-04 DIAGNOSIS — I10 Essential (primary) hypertension: Secondary | ICD-10-CM | POA: Diagnosis present

## 2018-08-04 DIAGNOSIS — I350 Nonrheumatic aortic (valve) stenosis: Secondary | ICD-10-CM | POA: Diagnosis present

## 2018-08-04 DIAGNOSIS — Z79899 Other long term (current) drug therapy: Secondary | ICD-10-CM | POA: Diagnosis not present

## 2018-08-04 DIAGNOSIS — Z23 Encounter for immunization: Secondary | ICD-10-CM

## 2018-08-04 DIAGNOSIS — Z8249 Family history of ischemic heart disease and other diseases of the circulatory system: Secondary | ICD-10-CM | POA: Diagnosis not present

## 2018-08-04 DIAGNOSIS — I712 Thoracic aortic aneurysm, without rupture, unspecified: Secondary | ICD-10-CM | POA: Diagnosis present

## 2018-08-04 DIAGNOSIS — Z888 Allergy status to other drugs, medicaments and biological substances status: Secondary | ICD-10-CM

## 2018-08-04 DIAGNOSIS — E785 Hyperlipidemia, unspecified: Secondary | ICD-10-CM | POA: Diagnosis present

## 2018-08-04 DIAGNOSIS — Z954 Presence of other heart-valve replacement: Secondary | ICD-10-CM | POA: Diagnosis not present

## 2018-08-04 HISTORY — PX: TRANSCATHETER AORTIC VALVE REPLACEMENT, TRANSFEMORAL: SHX6400

## 2018-08-04 HISTORY — DX: Presence of prosthetic heart valve: Z95.2

## 2018-08-04 HISTORY — PX: TEE WITHOUT CARDIOVERSION: SHX5443

## 2018-08-04 HISTORY — DX: Nonrheumatic aortic (valve) stenosis: I35.0

## 2018-08-04 HISTORY — DX: Thoracic aortic ectasia: I77.810

## 2018-08-04 LAB — POCT I-STAT 7, (LYTES, BLD GAS, ICA,H+H)
Acid-base deficit: 1 mmol/L (ref 0.0–2.0)
Bicarbonate: 24.3 mmol/L (ref 20.0–28.0)
Calcium, Ion: 1.23 mmol/L (ref 1.15–1.40)
HCT: 38 % — ABNORMAL LOW (ref 39.0–52.0)
Hemoglobin: 12.9 g/dL — ABNORMAL LOW (ref 13.0–17.0)
O2 Saturation: 100 %
Potassium: 4.2 mmol/L (ref 3.5–5.1)
Sodium: 142 mmol/L (ref 135–145)
TCO2: 26 mmol/L (ref 22–32)
pCO2 arterial: 43.4 mmHg (ref 32.0–48.0)
pH, Arterial: 7.357 (ref 7.350–7.450)
pO2, Arterial: 296 mmHg — ABNORMAL HIGH (ref 83.0–108.0)

## 2018-08-04 LAB — GLUCOSE, CAPILLARY: Glucose-Capillary: 82 mg/dL (ref 70–99)

## 2018-08-04 LAB — POCT I-STAT CREATININE: Creatinine, Ser: 0.8 mg/dL (ref 0.61–1.24)

## 2018-08-04 SURGERY — IMPLANTATION, AORTIC VALVE, TRANSCATHETER, FEMORAL APPROACH
Anesthesia: Monitor Anesthesia Care | Site: Chest

## 2018-08-04 MED ORDER — FENTANYL CITRATE (PF) 250 MCG/5ML IJ SOLN
INTRAMUSCULAR | Status: DC | PRN
  Administered 2018-08-04: 50 ug via INTRAVENOUS

## 2018-08-04 MED ORDER — ASPIRIN EC 81 MG PO TBEC
81.0000 mg | DELAYED_RELEASE_TABLET | Freq: Every evening | ORAL | Status: DC
  Administered 2018-08-04: 17:00:00 81 mg via ORAL
  Filled 2018-08-04: qty 1

## 2018-08-04 MED ORDER — SODIUM CHLORIDE 0.9 % IV SOLN
1.5000 g | Freq: Two times a day (BID) | INTRAVENOUS | Status: DC
  Administered 2018-08-04 – 2018-08-05 (×2): 1.5 g via INTRAVENOUS
  Filled 2018-08-04 (×3): qty 1.5

## 2018-08-04 MED ORDER — PHENYLEPHRINE HCL-NACL 20-0.9 MG/250ML-% IV SOLN
0.0000 ug/min | INTRAVENOUS | Status: DC
  Filled 2018-08-04: qty 250

## 2018-08-04 MED ORDER — HEPARIN (PORCINE) IN NACL 1000-0.9 UT/500ML-% IV SOLN
INTRAVENOUS | Status: AC
  Filled 2018-08-04: qty 500

## 2018-08-04 MED ORDER — MUPIROCIN 2 % EX OINT
1.0000 "application " | TOPICAL_OINTMENT | Freq: Two times a day (BID) | CUTANEOUS | Status: DC
  Administered 2018-08-04 – 2018-08-05 (×2): 1 via NASAL

## 2018-08-04 MED ORDER — MUPIROCIN 2 % EX OINT
1.0000 "application " | TOPICAL_OINTMENT | Freq: Once | CUTANEOUS | Status: AC
  Administered 2018-08-04: 1 via TOPICAL

## 2018-08-04 MED ORDER — VANCOMYCIN HCL IN DEXTROSE 1-5 GM/200ML-% IV SOLN
1000.0000 mg | Freq: Once | INTRAVENOUS | Status: AC
  Administered 2018-08-04: 1000 mg via INTRAVENOUS
  Filled 2018-08-04: qty 200

## 2018-08-04 MED ORDER — MUPIROCIN 2 % EX OINT
TOPICAL_OINTMENT | CUTANEOUS | Status: AC
  Administered 2018-08-04: 1 via TOPICAL
  Filled 2018-08-04: qty 22

## 2018-08-04 MED ORDER — SODIUM CHLORIDE 0.9 % IV SOLN
INTRAVENOUS | Status: DC
  Administered 2018-08-04 (×2): via INTRAVENOUS

## 2018-08-04 MED ORDER — PROPOFOL 500 MG/50ML IV EMUL
INTRAVENOUS | Status: DC | PRN
  Administered 2018-08-04: 30 ug/kg/min via INTRAVENOUS

## 2018-08-04 MED ORDER — NOREPINEPHRINE BITARTRATE 1 MG/ML IV SOLN
INTRAVENOUS | Status: DC | PRN
  Administered 2018-08-04: 2 ug/min via INTRAVENOUS

## 2018-08-04 MED ORDER — PHENYLEPHRINE HCL (PRESSORS) 10 MG/ML IV SOLN
INTRAVENOUS | Status: DC | PRN
  Administered 2018-08-04: 40 ug via INTRAVENOUS
  Administered 2018-08-04 (×2): 80 ug via INTRAVENOUS

## 2018-08-04 MED ORDER — ONDANSETRON HCL 4 MG/2ML IJ SOLN
INTRAMUSCULAR | Status: DC | PRN
  Administered 2018-08-04: 4 mg via INTRAVENOUS

## 2018-08-04 MED ORDER — SODIUM CHLORIDE 0.9% FLUSH: 3.0000 mL | INTRAVENOUS | Status: DC | PRN

## 2018-08-04 MED ORDER — OXYCODONE HCL 5 MG PO TABS: 5.0000 mg | ORAL_TABLET | ORAL | Status: DC | PRN

## 2018-08-04 MED ORDER — CLOPIDOGREL BISULFATE 75 MG PO TABS
75.0000 mg | ORAL_TABLET | Freq: Every day | ORAL | Status: DC
  Administered 2018-08-05: 75 mg via ORAL
  Filled 2018-08-04: qty 1

## 2018-08-04 MED ORDER — SODIUM CHLORIDE 0.9 % IV SOLN
INTRAVENOUS | Status: DC
  Administered 2018-08-04: 14:00:00 via INTRAVENOUS

## 2018-08-04 MED ORDER — SODIUM CHLORIDE 0.9 % IV SOLN: 250.0000 mL | INTRAVENOUS | Status: DC | PRN

## 2018-08-04 MED ORDER — CHLORHEXIDINE GLUCONATE 4 % EX LIQD
60.0000 mL | Freq: Once | CUTANEOUS | Status: DC
  Filled 2018-08-04: qty 60

## 2018-08-04 MED ORDER — ACETAMINOPHEN 650 MG RE SUPP: 650.0000 mg | Freq: Four times a day (QID) | RECTAL | Status: DC | PRN

## 2018-08-04 MED ORDER — ACETAMINOPHEN 325 MG PO TABS: 650.0000 mg | ORAL_TABLET | Freq: Four times a day (QID) | ORAL | Status: DC | PRN

## 2018-08-04 MED ORDER — HEPARIN (PORCINE) IN NACL 1000-0.9 UT/500ML-% IV SOLN
INTRAVENOUS | Status: DC | PRN
  Administered 2018-08-04 (×3): 500 mL

## 2018-08-04 MED ORDER — CHLORHEXIDINE GLUCONATE CLOTH 2 % EX PADS
6.0000 | MEDICATED_PAD | Freq: Every day | CUTANEOUS | Status: DC
  Administered 2018-08-04: 14:00:00 6 via TOPICAL

## 2018-08-04 MED ORDER — SODIUM CHLORIDE 0.9% FLUSH
3.0000 mL | Freq: Two times a day (BID) | INTRAVENOUS | Status: DC
  Administered 2018-08-04: 3 mL via INTRAVENOUS

## 2018-08-04 MED ORDER — DEXMEDETOMIDINE HCL IN NACL 400 MCG/100ML IV SOLN
INTRAVENOUS | Status: DC | PRN
  Administered 2018-08-04: 1 ug/kg/h via INTRAVENOUS

## 2018-08-04 MED ORDER — TRAMADOL HCL 50 MG PO TABS: 50.0000 mg | ORAL_TABLET | ORAL | Status: DC | PRN

## 2018-08-04 MED ORDER — LIDOCAINE HCL (PF) 1 % IJ SOLN
INTRAMUSCULAR | Status: AC
  Filled 2018-08-04: qty 30

## 2018-08-04 MED ORDER — LACTATED RINGERS IV SOLN
INTRAVENOUS | Status: DC | PRN
  Administered 2018-08-04: 08:00:00 via INTRAVENOUS

## 2018-08-04 MED ORDER — LIDOCAINE HCL (PF) 1 % IJ SOLN
INTRAMUSCULAR | Status: DC | PRN
  Administered 2018-08-04: 10 mL

## 2018-08-04 MED ORDER — HEPARIN SODIUM (PORCINE) 1000 UNIT/ML IJ SOLN
INTRAMUSCULAR | Status: DC | PRN
  Administered 2018-08-04: 13000 [IU] via INTRAVENOUS

## 2018-08-04 MED ORDER — ONDANSETRON HCL 4 MG/2ML IJ SOLN: 4.0000 mg | Freq: Four times a day (QID) | INTRAMUSCULAR | Status: DC | PRN

## 2018-08-04 MED ORDER — MORPHINE SULFATE (PF) 2 MG/ML IV SOLN: 1.0000 mg | INTRAVENOUS | Status: DC | PRN

## 2018-08-04 MED ORDER — PNEUMOCOCCAL VAC POLYVALENT 25 MCG/0.5ML IJ INJ
0.5000 mL | INJECTION | INTRAMUSCULAR | Status: AC
  Administered 2018-08-05: 0.5 mL via INTRAMUSCULAR
  Filled 2018-08-04: qty 0.5

## 2018-08-04 MED ORDER — CHLORHEXIDINE GLUCONATE 4 % EX LIQD
30.0000 mL | CUTANEOUS | Status: DC
  Filled 2018-08-04: qty 30

## 2018-08-04 MED ORDER — CHLORHEXIDINE GLUCONATE 0.12 % MT SOLN
15.0000 mL | Freq: Once | OROMUCOSAL | Status: AC
  Administered 2018-08-04: 15 mL via OROMUCOSAL
  Filled 2018-08-04 (×2): qty 15

## 2018-08-04 MED ORDER — DEXMEDETOMIDINE HCL 200 MCG/2ML IV SOLN
INTRAVENOUS | Status: DC | PRN
  Administered 2018-08-04: 87.2 ug via INTRAVENOUS

## 2018-08-04 MED ORDER — IOHEXOL 350 MG/ML SOLN
INTRAVENOUS | Status: DC | PRN
  Administered 2018-08-04: 90 mL via INTRA_ARTERIAL

## 2018-08-04 MED ORDER — NITROGLYCERIN IN D5W 200-5 MCG/ML-% IV SOLN: 0.0000 ug/min | INTRAVENOUS | Status: DC

## 2018-08-04 MED ORDER — PROTAMINE SULFATE 10 MG/ML IV SOLN
INTRAVENOUS | Status: DC | PRN
  Administered 2018-08-04: 20 mg via INTRAVENOUS
  Administered 2018-08-04: 50 mg via INTRAVENOUS
  Administered 2018-08-04: 10 mg via INTRAVENOUS
  Administered 2018-08-04: 50 mg via INTRAVENOUS

## 2018-08-04 MED ORDER — ROSUVASTATIN CALCIUM 5 MG PO TABS
10.0000 mg | ORAL_TABLET | ORAL | Status: DC
  Administered 2018-08-05: 10 mg via ORAL
  Filled 2018-08-04: qty 2

## 2018-08-04 SURGICAL SUPPLY — 34 items
BAG SNAP BAND KOVER 36X36 (MISCELLANEOUS) ×8 IMPLANT
BLANKET WARM UNDERBOD FULL ACC (MISCELLANEOUS) ×4 IMPLANT
CABLE ADAPT PACING TEMP 12FT (ADAPTER) ×2 IMPLANT
CATH 29 EDWARDS DELIVERY SYS (CATHETERS) ×2 IMPLANT
CATH DIAG 6FR PIGTAIL ANGLED (CATHETERS) ×4 IMPLANT
CATH INFINITI 6F AL2 (CATHETERS) ×2 IMPLANT
CATH S G BIP PACING (CATHETERS) ×2 IMPLANT
CLOSURE MYNX CONTROL 6F/7F (Vascular Products) ×2 IMPLANT
CRIMPER (MISCELLANEOUS) ×2 IMPLANT
DEVICE CLOSURE PERCLS PRGLD 6F (VASCULAR PRODUCTS) IMPLANT
DEVICE INFLATION ATRION QL38 (MISCELLANEOUS) ×2 IMPLANT
ELECT DEFIB PAD ADLT CADENCE (PAD) ×2 IMPLANT
GUIDEWIRE SAFE TJ AMPLATZ EXST (WIRE) ×2 IMPLANT
KIT HEART LEFT (KITS) ×4 IMPLANT
KIT MICROPUNCTURE NIT STIFF (SHEATH) ×2 IMPLANT
PACK CARDIAC CATHETERIZATION (CUSTOM PROCEDURE TRAY) ×4 IMPLANT
PERCLOSE PROGLIDE 6F (VASCULAR PRODUCTS) ×8
SHEATH 16X36 EDWARDS (SHEATH) ×2 IMPLANT
SHEATH BRITE TIP 7FR 35CM (SHEATH) ×2 IMPLANT
SHEATH PINNACLE 6F 10CM (SHEATH) ×2 IMPLANT
SHEATH PINNACLE 8F 10CM (SHEATH) ×2 IMPLANT
SHEATH PROBE COVER 6X72 (BAG) ×2 IMPLANT
SLEEVE REPOSITIONING LENGTH 30 (MISCELLANEOUS) ×2 IMPLANT
STOPCOCK MORSE 400PSI 3WAY (MISCELLANEOUS) ×8 IMPLANT
SYR MEDRAD MARK V 150ML (SYRINGE) ×2 IMPLANT
TRANSDUCER W/STOPCOCK (MISCELLANEOUS) ×8 IMPLANT
TUBE CONN 8.8X1320 FR HP M-F (CONNECTOR) ×2 IMPLANT
TUBING ART PRESS 72  MALE/FEM (TUBING) ×2
TUBING ART PRESS 72 MALE/FEM (TUBING) IMPLANT
VALVE HEART TRANSCATH SZ3 29MM (Valve) ×2 IMPLANT
WIRE AMPLATZ SS-J .035X180CM (WIRE) ×2 IMPLANT
WIRE EMERALD 3MM-J .035X150CM (WIRE) ×2 IMPLANT
WIRE EMERALD 3MM-J .035X260CM (WIRE) ×2 IMPLANT
WIRE EMERALD ST .035X260CM (WIRE) ×2 IMPLANT

## 2018-08-04 NOTE — Progress Notes (Signed)
Patient ambulated in hallway with nursing staff. Gait steady. Patient tolerated well. Back in room call bell with in reach. Mckenzie Toruno, Bettina Gavia rN

## 2018-08-04 NOTE — Transfer of Care (Signed)
Immediate Anesthesia Transfer of Care Note  Patient: Sean Abbott  Procedure(s) Performed: TRANSCATHETER AORTIC VALVE REPLACEMENT, TRANSFEMORAL (N/A Chest) TRANSESOPHAGEAL ECHOCARDIOGRAM (TEE) (N/A )  Patient Location: Cath Lab  Anesthesia Type:MAC  Level of Consciousness: awake, alert  and oriented  Airway & Oxygen Therapy: Patient Spontanous Breathing and Patient connected to face mask oxygen  Post-op Assessment: Report given to RN and Post -op Vital signs reviewed and stable  Post vital signs: Reviewed and stable  Last Vitals:  Vitals Value Taken Time  BP 130/61 08/04/18 1137  Temp    Pulse 60 08/04/18 1138  Resp 16 08/04/18 1138  SpO2 100 % 08/04/18 1138  Vitals shown include unvalidated device data.  Last Pain:  Vitals:   08/04/18 0720  TempSrc: Oral  PainSc: 0-No pain      Patients Stated Pain Goal: 2 (81/01/75 1025)  Complications: No apparent anesthesia complications

## 2018-08-04 NOTE — Progress Notes (Signed)
Patient arrived from cath lab to 4e24. B groins level 0, placed on monitor and vital signs obtained call bell with in reach will monitor patient .Nawal Burling, Bettina Gavia RN

## 2018-08-04 NOTE — Progress Notes (Signed)
Wife called and updated on patient. All questions answered. Will monitor patient. Rahi Chandonnet, Bettina Gavia rN

## 2018-08-04 NOTE — Op Note (Signed)
[facility]   MULTIDISCIPLINARY HEART VALVE TEAM   TAVR OPERATIVE NOTE   Date of Procedure:  08/04/2018  Preoperative Diagnosis: Severe Aortic Stenosis   Postoperative Diagnosis: Same   Procedure:    Transcatheter Aortic Valve Replacement - Percutaneous Right Transfemoral Approach  Edwards Sapien 3 THV (size 29 mm, model # 9600TFX, serial # [id number])   Co-Surgeons:  Gaye Pollack, MD and Sherren Mocha, MD   Anesthesiologist:  Suzette Battiest, MD  Echocardiographer:  Jenkins Rouge, MD  Pre-operative Echo Findings:  Severe bicuspid aortic valve stenosis  normal left ventricular systolic function  Post-operative Echo Findings:  no paravalvular leak  normal left ventricular systolic function   BRIEF CLINICAL NOTE AND INDICATIONS FOR SURGERY  This 73 year old gentleman has stage D, severe, symptomatic aortic stenosis with New York Heart Association class II-III symptoms of exertional fatigue and shortness of breath consistent with chronic diastolic congestive heart failure. I have personally reviewed his 2D echocardiogram, cardiac catheterization, and CTA studies. His echocardiogram shows a bicuspid aortic valve with a raphae between the right and left coronary cusp. The valve leaflets are severely calcified with reduced mobility. The mean gradient across the aortic valve was 49 mmHg consistent with severe aortic stenosis. Left ventricular ejection fraction is normal. Cardiac catheterization shows no significant coronary disease. His CTA of the chest shows a 4.1 cm fusiform ascending aortic aneurysm which is unchanged compared to prior CT scans dating back to 12/12/2005 when it measured 4.0 cm. The distal descending thoracic aorta above the diaphragm measures 2.1 cm. I agree that aortic valve replacement is indicated in this patient with progressive symptoms of severe aortic stenosis to prevent progressive left ventricular deterioration and to improve his quality  of life.  The patient was counseled at length regarding treatment alternatives for management of severe symptomatic aortic stenosis. The risks and benefits of surgical intervention has been discussed in detail. Long-term prognosis with medical therapy was discussed. Alternative approaches such as conventional surgical aortic valve replacement, transcatheter aortic valve replacement, and palliative medical therapy were compared and contrasted at length. This discussion was placed in the context of the patient's own specific clinical presentation and past medical history. All of their questions been addressed. He is very active playing golf and working out and would prefer being treated with TAVR rather than open surgical aortic valve placement. He is a low risk surgical patient but given his age of 29 years I think TAVR is a reasonable alternative. He does have an ascending aortic aneurysm measuring 4.1 cm but this is been essentially unchanged dating back to 2007. This is still well below the surgical threshold for aneurysm replacement although if I was going to do open surgical aortic valve replacement I would give strong consideration to replacing his aorta at the same time since it is about 2 times the size of his distal descending aorta. I reviewed the CTA images with him and his wife and answered all their questions. He would prefer proceeding with TAVR.  Following the decision to proceed with transcatheter aortic valve replacement, a discussion was held regarding what types of management strategies would be attempted intraoperatively in the event of life-threatening complications, including whether or not the patient would be considered a candidate for the use of cardiopulmonary bypass and/or conversion to open sternotomy for attempted surgical intervention. He is certainly a candidate for emergent sternotomy if necessary to manage any intraoperative complications.  The patient has been advised of a variety  of complications that  might develop including but not limited to risks of death, stroke, paravalvular leak, aortic dissection or other major vascular complications, aortic annulus rupture, device embolization, cardiac rupture or perforation, mitral regurgitation, acute myocardial infarction, arrhythmia, heart block or bradycardia requiring permanent pacemaker placement, congestive heart failure, respiratory failure, renal failure, pneumonia, infection, other late complications related to structural valve deterioration or migration, or other complications that might ultimately cause a temporary or permanent loss of functional independence or other long term morbidity. His electrocardiogram shows incomplete right bundle branch block and I think he would be at increased risk for needing a permanent pacemaker following either transcatheter aortic valve replacement or open surgical aortic valve replacement. I discussed this with him. The patient provides full informed consent for the procedure as described and all questions were answered.    DETAILS OF THE OPERATIVE PROCEDURE  PREPARATION:    The patient is brought to the operating room on the above mentioned date and appropriate monitoring was established by the anesthesia team. The patient is placed in the supine position on the operating table.  Intravenous antibiotics are administered. The patient is monitored closely throughout the procedure under conscious sedation.    Baseline transthoracic echocardiogram was performed. The patient's chest, abdomen, both groins, and both lower extremities are prepared and draped in a sterile manner. A time out procedure is performed.   PERIPHERAL ACCESS:    Using the modified Seldinger technique, femoral arterial and venous access was obtained with placement of 6 Fr sheaths on the left side.  A pigtail diagnostic catheter was passed through the left arterial sheath under fluoroscopic guidance into the aortic root.   A temporary transvenous pacemaker catheter was passed through the left femoral venous sheath under fluoroscopic guidance into the right ventricle.  The pacemaker was tested to ensure stable lead placement and pacemaker capture. Aortic root angiography was performed in order to determine the optimal angiographic angle for valve deployment.   TRANSFEMORAL ACCESS:   Percutaneous transfemoral access and sheath placement was performed using ultrasound guidance.  The right common femoral artery was cannulated using a micropuncture needle and appropriate location was verified using hand injection angiogram.  A pair of Abbott Perclose percutaneous closure devices were placed and a 6 French sheath replaced into the femoral artery.  The patient was heparinized systemically and ACT verified > 250 seconds.    A 16 Fr transfemoral E-sheath was introduced into the right common femoral artery after progressively dilating over an Amplatz superstiff wire. An AL-2 catheter was used to direct a straight-tip exchange length wire across the native aortic valve into the left ventricle. This was exchanged out for a pigtail catheter and position was confirmed in the LV apex. Simultaneous LV and Ao pressures were recorded.  The pigtail catheter was exchanged for an Amplatz Extra-stiff wire in the LV apex.    BALLOON AORTIC VALVULOPLASTY:   Not performed    TRANSCATHETER HEART VALVE DEPLOYMENT:   An Edwards Sapien 3 transcatheter heart valve (size 29 mm, model #9600TFX, serial #[id number]) was prepared and crimped per manufacturer's guidelines, and the proper orientation of the valve is confirmed on the [employer] delivery system. The valve was advanced through the introducer sheath using normal technique until in an appropriate position in the abdominal aorta beyond the sheath tip. The balloon was then retracted and using the fine-tuning wheel was centered on the valve. The valve was then advanced across the aortic arch  using appropriate flexion of the catheter. The valve was carefully positioned  across the aortic valve annulus. The Commander catheter was retracted using normal technique. Once final position of the valve has been confirmed by angiographic assessment, the valve is deployed while temporarily holding ventilation and during rapid ventricular pacing to maintain systolic blood pressure < 50 mmHg and pulse pressure < 10 mmHg. The balloon inflation is held for >3 seconds after reaching full deployment volume. Once the balloon has fully deflated the balloon is retracted into the ascending aorta and valve function is assessed using echocardiography. There is felt to be no paravalvular leak and no central aortic insufficiency.  The patient's hemodynamic recovery following valve deployment is good.  The deployment balloon and guidewire are both removed.    PROCEDURE COMPLETION:   The sheath was removed and femoral artery closure performed.  Protamine was administered once femoral arterial repair was complete. The temporary pacemaker, pigtail catheters and femoral sheaths were removed with manual pressure used for hemostasis.  A Mynx femoral closure device was utilized following removal of the diagnostic sheath in the left femoral artery.  The patient tolerated the procedure well and is transported to the surgical intensive care in stable condition. There were no immediate intraoperative complications. All sponge instrument and needle counts are verified correct at completion of the operation.   No blood products were administered during the operation.  The patient received a total of 90 mL of intravenous contrast during the procedure.   Gaye Pollack, MD 08/04/2018 3:04 PM

## 2018-08-04 NOTE — Anesthesia Postprocedure Evaluation (Signed)
Anesthesia Post Note  Patient: [name]  Procedure(s) Performed: TRANSCATHETER AORTIC VALVE REPLACEMENT, TRANSFEMORAL (N/A Chest) TRANSESOPHAGEAL ECHOCARDIOGRAM (TEE) (N/A )     Patient location during evaluation: PACU Anesthesia Type: MAC Level of consciousness: awake and alert Pain management: pain level controlled Vital Signs Assessment: post-procedure vital signs reviewed and stable Respiratory status: spontaneous breathing, nonlabored ventilation, respiratory function stable and patient connected to nasal cannula oxygen Cardiovascular status: stable and blood pressure returned to baseline Postop Assessment: no apparent nausea or vomiting Anesthetic complications: no    Last Vitals:  Vitals:   08/04/18 1500 08/04/18 1530  BP: 115/73 133/63  Pulse: 61 62  Resp: 12 18  Temp:    SpO2: 100% 99%    Last Pain:  Vitals:   08/04/18 1409  TempSrc:   PainSc: 0-No pain                 Tiajuana Amass

## 2018-08-04 NOTE — Progress Notes (Signed)
  [facility]   MULTIDISCIPLINARY HEART VALVE TEAM  Patient doing well s/p TAVR. He is hemodynamically stable. Groin sites stable. ECG with no high grade block. Arterial line discontinued and transferred  to 4E. Plan for early ambulation after bedrest completed and hopeful discharge over the next 24-48 hours.   Cira Deyoe PA-C  [facility]  Pager [phone]  

## 2018-08-04 NOTE — Progress Notes (Signed)
  Echocardiogram 2D Echocardiogram has been performed.  Bobbye Charleston 08/04/2018, 11:21 AM

## 2018-08-04 NOTE — Progress Notes (Signed)
Patient bed rest complete. Lt groin with slight shadowing to dressing rt groin clean dry and intact. Patient ambulated to bathroom and to chair.  Call bell with in reach. Will monitor patient. Ariyel Jeangilles, Bettina Gavia rN

## 2018-08-04 NOTE — Op Note (Signed)
[facility]   MULTIDISCIPLINARY HEART VALVE TEAM   TAVR OPERATIVE NOTE   Date of Procedure:  08/04/2018  Preoperative Diagnosis: Severe Aortic Stenosis   Postoperative Diagnosis: Same   Procedure:    Transcatheter Aortic Valve Replacement - Percutaneous  Transfemoral Approach  Edwards Sapien 3 THV (size 29 mm, model # 9600TFX, serial # [id number])   Co-Surgeons:            Gaye Pollack, MD and Sherren Mocha, MD  Anesthesiologist:  Rodman Comp, MD  Echocardiographer:  Jenkins Rouge, MD  Pre-operative Echo Findings:  Severe bicuspid aortic stenosis  Normal left ventricular systolic function  Post-operative Echo Findings:  No paravalvular leak  unchanged left ventricular systolic function  BRIEF CLINICAL NOTE AND INDICATIONS FOR SURGERY  73 year old gentleman with hypertension, mixed hyperlipidemia, and severe symptomatic bicuspid aortic valve stenosis.  The patient has developed progressive aortic stenosis now with New York Heart Association functional class II symptoms of exertional dyspnea and fatigue.  He has undergone extensive multidisciplinary heart team evaluation including right and left heart catheterization, gated CT angiogram of the heart, and CT angiogram of the chest, abdomen, and pelvis.  The patient was formally evaluated by Dr. Cyndia Bent in cardiac surgical consultation.  After careful review of his case, we elected to proceed with TAVR via a right transfemoral approach.  Based upon review of all of the patient's preoperative diagnostic tests they are felt to be candidate for transcatheter aortic valve replacement using the transfemoral approach as an alternative to conventional surgery.    Following the decision to proceed with transcatheter aortic valve replacement, a discussion has been held regarding what types of management strategies would be attempted intraoperatively in the event of life-threatening complications, including whether or  not the patient would be considered a candidate for the use of cardiopulmonary bypass and/or conversion to open sternotomy for attempted surgical intervention.  The patient has been advised of a variety of complications that might develop peculiar to this approach including but not limited to risks of death, stroke, paravalvular leak, aortic dissection or other major vascular complications, aortic annulus rupture, device embolization, cardiac rupture or perforation, acute myocardial infarction, arrhythmia, heart block or bradycardia requiring permanent pacemaker placement, congestive heart failure, respiratory failure, renal failure, pneumonia, infection, other late complications related to structural valve deterioration or migration, or other complications that might ultimately cause a temporary or permanent loss of functional independence or other long term morbidity.  The patient provides full informed consent for the procedure as described and all questions were answered preoperatively.  DETAILS OF THE OPERATIVE PROCEDURE  PREPARATION:   The patient is brought to the operating room on the above mentioned date and central monitoring was established by the anesthesia team including placement of a central venous catheter and radial arterial line. The patient is placed in the supine position on the operating table.  Intravenous antibiotics are administered. The patient is monitored closely throughout the procedure under conscious sedation.  Baseline transthoracic echocardiogram is performed. The patient's chest, abdomen, both groins, and both lower extremities are prepared and draped in a sterile manner. A time out procedure is performed.   PERIPHERAL ACCESS:   Using ultrasound guidance, femoral arterial and venous access is obtained with placement of 6 Fr sheaths on the left side.  A pigtail diagnostic catheter was passed through the femoral arterial sheath under fluoroscopic guidance into the aortic  root.  A temporary transvenous pacemaker catheter was passed through the femoral venous sheath under  fluoroscopic guidance into the right ventricle.  The pacemaker was tested to ensure stable lead placement and pacemaker capture. Aortic root angiography was performed in order to determine the optimal angiographic angle for valve deployment.  TRANSFEMORAL ACCESS:  A micropuncture technique is used to access the right femoral artery under fluoroscopic and ultrasound guidance.  2 Perclose devices are deployed at 10' and 2' positions to 'PreClose' the femoral artery. An 8 French sheath is placed and then an Amplatz Superstiff wire is advanced through the sheath. This is changed out for a 16 French transfemoral E-Sheath after progressively dilating over the Superstiff wire.  An AL-2 catheter was used to direct a straight-tip exchange length wire across the native aortic valve into the left ventricle. This was exchanged out for a pigtail catheter and position was confirmed in the LV apex. Simultaneous LV and Ao pressures were recorded.  The pigtail catheter was exchanged for an Amplatz Extra-stiff wire in the LV apex.    TRANSCATHETER HEART VALVE DEPLOYMENT:  An Edwards Sapien 3 transcatheter heart valve (size 29 mm, model #9600TFX, serial #[id number]) was prepared and crimped per manufacturer's guidelines, and the proper orientation of the valve is confirmed on the [employer] delivery system. The valve was advanced through the introducer sheath using normal technique until in an appropriate position in the abdominal aorta beyond the sheath tip. The balloon was then retracted and using the fine-tuning wheel was centered on the valve. The valve was then advanced across the aortic arch using appropriate flexion of the catheter. The valve was carefully positioned across the aortic valve annulus. The Commander catheter was retracted using normal technique. Once final position of the valve has been confirmed by  angiographic assessment, the valve is deployed while temporarily holding ventilation and during rapid ventricular pacing to maintain systolic blood pressure < 50 mmHg and pulse pressure < 10 mmHg. The balloon inflation is held for >3 seconds after reaching full deployment volume. Once the balloon has fully deflated the balloon is retracted into the ascending aorta and valve function is assessed using echocardiography. There is felt to be no paravalvular leak and no central aortic insufficiency.  The patient's hemodynamic recovery following valve deployment is good.  The deployment balloon and guidewire are both removed. Echo demostrated acceptable post-procedural gradients, stable mitral valve function, and no aortic insufficiency.    PROCEDURE COMPLETION:  The sheath was removed and femoral artery closure is performed using the 2 previously deployed Perclose devices.  Protamine is administered once femoral arterial repair was complete. The site is clear with no evidence of bleeding or hematoma after the sutures are tightened. The temporary pacemaker, pigtail catheters and femoral sheaths were removed with manual pressure used for hemostasis.   The patient tolerated the procedure well and is transported to the surgical intensive care in stable condition. There were no immediate intraoperative complications. All sponge instrument and needle counts are verified correct at completion of the operation.   The patient received a total of 90 mL of intravenous contrast during the procedure.   Sherren Mocha, MD 08/04/2018 11:38 AM

## 2018-08-04 NOTE — Interval H&P Note (Signed)
History and Physical Interval Note:  08/04/2018 9:47 AM  Sean Abbott  has presented today for surgery, with the diagnosis of Severe Aortic Stenosis.  The various methods of treatment have been discussed with the patient and family. After consideration of risks, benefits and other options for treatment, the patient has consented to  Procedure(s): TRANSCATHETER AORTIC VALVE REPLACEMENT, TRANSFEMORAL (N/A) TRANSESOPHAGEAL ECHOCARDIOGRAM (TEE) (N/A) as a surgical intervention.  The patient's history has been reviewed, patient examined, no change in status, stable for surgery.  I have reviewed the patient's chart and labs.  Questions were answered to the patient's satisfaction.     Shortness of breath: Yes.   If yes: with what activity?: Playing golf or walking Worse than previously noted?: No.  New edema, PND, orthopnea: No.  Recent decrease in activity i.e. more difficulty walking to mailbox, climbing stairs, etc: No.  Changes in sleeping i.e. need to utilize to sleep on more pillows, sitting up, etc: No.  Changes since last seen in pre-op visit: No.  Gaye Pollack

## 2018-08-04 NOTE — Progress Notes (Signed)
Patient ID: Sean Abbott, male   DOB: May 04, 1945, 73 y.o.   MRN: [id number] TCTS:  Hemodynamically stable in sinus rhythm 60's.  Feels fine. Alert and wake. Has ambulated. Groin sites ok Feet warm and pink.

## 2018-08-04 NOTE — Anesthesia Procedure Notes (Signed)
Arterial Line Insertion Start/End6/16/2020 8:15 AM, 08/04/2018 8:25 AM Performed by: Wilburn Cornelia, CRNA, CRNA  Patient location: Pre-op. Preanesthetic checklist: patient identified, IV checked, site marked, risks and benefits discussed, surgical consent, monitors and equipment checked, pre-op evaluation, timeout performed and anesthesia consent Lidocaine 1% used for infiltration Right, radial was placed Catheter size: 20 G Hand hygiene performed , maximum sterile barriers used  and Seldinger technique used Allen's test indicative of satisfactory collateral circulation Attempts: 2 Procedure performed without using ultrasound guided technique. Following insertion, Biopatch and dressing applied. Post procedure assessment: normal  Patient tolerated the procedure well with no immediate complications.

## 2018-08-04 NOTE — Anesthesia Procedure Notes (Signed)
Procedure Name: MAC Date/Time: 08/04/2018 9:43 AM Performed by: Jearld Pies, CRNA Pre-anesthesia Checklist: Patient identified, Emergency Drugs available, Suction available and Patient being monitored Patient Re-evaluated:Patient Re-evaluated prior to induction Oxygen Delivery Method: Simple face mask Preoxygenation: Pre-oxygenation with 100% oxygen

## 2018-08-05 ENCOUNTER — Inpatient Hospital Stay (HOSPITAL_COMMUNITY): Payer: Medicare Other

## 2018-08-05 ENCOUNTER — Encounter (HOSPITAL_COMMUNITY): Payer: Self-pay | Admitting: Cardiovascular Disease

## 2018-08-05 DIAGNOSIS — I350 Nonrheumatic aortic (valve) stenosis: Secondary | ICD-10-CM

## 2018-08-05 DIAGNOSIS — Z952 Presence of prosthetic heart valve: Secondary | ICD-10-CM

## 2018-08-05 DIAGNOSIS — Z954 Presence of other heart-valve replacement: Secondary | ICD-10-CM

## 2018-08-05 LAB — POCT I-STAT 4, (NA,K, GLUC, HGB,HCT)
Glucose, Bld: 92 mg/dL (ref 70–99)
Glucose, Bld: 97 mg/dL (ref 70–99)
HCT: 41 % (ref 39.0–52.0)
HCT: 37 % — ABNORMAL LOW (ref 39.0–52.0)
Hemoglobin: 13.9 g/dL (ref 13.0–17.0)
Hemoglobin: 12.6 g/dL — ABNORMAL LOW (ref 13.0–17.0)
Potassium: 3.9 mmol/L (ref 3.5–5.1)
Potassium: 4 mmol/L (ref 3.5–5.1)
Sodium: 142 mmol/L (ref 135–145)
Sodium: 140 mmol/L (ref 135–145)

## 2018-08-05 LAB — ECHOCARDIOGRAM COMPLETE
Height: 70 in
Weight: 3075.86 oz

## 2018-08-05 LAB — CBC
HCT: 37.2 % — ABNORMAL LOW (ref 39.0–52.0)
Hemoglobin: 12.9 g/dL — ABNORMAL LOW (ref 13.0–17.0)
MCH: 31.5 pg (ref 26.0–34.0)
MCHC: 34.7 g/dL (ref 30.0–36.0)
MCV: 91 fL (ref 80.0–100.0)
Platelets: 126 10*3/uL — ABNORMAL LOW (ref 150–400)
RBC: 4.09 MIL/uL — ABNORMAL LOW (ref 4.22–5.81)
RDW: 12.1 % (ref 11.5–15.5)
WBC: 8.2 10*3/uL (ref 4.0–10.5)
nRBC: 0 % (ref 0.0–0.2)

## 2018-08-05 LAB — BASIC METABOLIC PANEL
Anion gap: 9 (ref 5–15)
BUN: 21 mg/dL (ref 8–23)
CO2: 22 mmol/L (ref 22–32)
Calcium: 8.6 mg/dL — ABNORMAL LOW (ref 8.9–10.3)
Chloride: 109 mmol/L (ref 98–111)
Creatinine, Ser: 1.17 mg/dL (ref 0.61–1.24)
GFR calc Af Amer: >60 mL/min (ref >60)
GFR calc non Af Amer: >60 mL/min (ref >60)
Glucose, Bld: 95 mg/dL (ref 70–99)
Potassium: 3.7 mmol/L (ref 3.5–5.1)
Sodium: 140 mmol/L (ref 135–145)

## 2018-08-05 LAB — POCT ACTIVATED CLOTTING TIME
Activated Clotting Time: 116 seconds
Activated Clotting Time: 267 seconds
Activated Clotting Time: 99 seconds

## 2018-08-05 LAB — MAGNESIUM: Magnesium: 1.7 mg/dL (ref 1.7–2.4)

## 2018-08-05 MED ORDER — CLOPIDOGREL BISULFATE 75 MG PO TABS: 75.0000 mg | ORAL_TABLET | Freq: Every day | ORAL | 1 refills | Status: DC

## 2018-08-05 MED ORDER — AMOXICILLIN 500 MG PO TABS: 2000.0000 mg | ORAL_TABLET | ORAL | 12 refills | Status: DC

## 2018-08-05 NOTE — Plan of Care (Signed)

## 2018-08-05 NOTE — Progress Notes (Signed)
  Echocardiogram 2D Echocardiogram has been performed.  Sean Abbott 08/05/2018, 10:32 AM

## 2018-08-05 NOTE — Discharge Summary (Signed)
Sean Abbott  Discharge Summary    Patient ID: Sean Abbott MRN: [id number]; DOB: Jul 30, 1945  Admit date: 08/04/2018 Discharge date: 08/05/2018  Primary Care Provider: Default, Provider, MD  Primary Cardiologist: Dr. Gwenlyn Found / Dr. Burt Knack & Dr. Cyndia Bent (TAVR) --> lives in [place] and followed by a cardiology group there.   Discharge Diagnoses    Principal Problem:   S/P TAVR (transcatheter aortic valve replacement) Active Problems:   Thoracic aortic aneurysm (HCC)   Severe aortic stenosis   Hyperlipidemia   Hypertension   Allergies Allergies  Allergen Reactions   Lipitor [Atorvastatin] Other (See Comments)    Myalgias   Demerol [Meperidine] Other (See Comments)    HOT/AGITATED    Diagnostic Studies/Procedures    TAVR OPERATIVE NOTE   Date of Procedure:                08/04/2018  Preoperative Diagnosis:      Severe Aortic Stenosis   Postoperative Diagnosis:    Same   Procedure:        Transcatheter Aortic Valve Replacement - Percutaneous Right Transfemoral Approach             Edwards Sapien 3 THV (size 29 mm, model # [id number], serial # [id number])              Co-Surgeons:                        Gaye Pollack, MD and Sherren Mocha, MD   Anesthesiologist:                  Suzette Battiest, MD  Echocardiographer:              Jenkins Rouge, MD  Pre-operative Echo Findings: ? Severe bicuspid aortic valve stenosis ? normal left ventricular systolic function  Post-operative Echo Findings: ? no paravalvular leak ? normal left ventricular systolic function   _____________    Echo 08/05/18: completed but pending formal read at time of discharge   History of Present Illness     Sean Abbott is a 73 y.o. male with a history of HTN, HLD, bicuspid Aov with severe AS who presented to [facility] on 08/04/18 for planned TAVR.   He was first diagnosed with a heart murmur in 2007 and has been followed by  a cardiologist in [place] where he currently lives.  He was noted to have progressive aortic stenosis on his most recent echocardiogram with a mean gradient of 49 mm across aortic valve.  He is an active gentleman who plays golf 3 days/week and goes to the gym on the other days of the week.  He has had progressive exertional fatigue and shortness of breath over the past year and only can walk about 9 holes playing golf before he has to stop. He called his previous cardiologist, Dr. Gwenlyn Found, who recommended TAVR and the patient decided to travel to [facility] for TAVR work up. He underwent [facility] 07/20/18 which showed normal cors and confirmed severe AS.   The patient has been evaluated by the multidisciplinary valve Abbott and felt to have severe, symptomatic aortic stenosis and to be a suitable candidate for TAVR, which was set up for 08/04/18.    Hospital Course     Consultants: none   Severe AS: s/p successful TAVR with a 29 mm Edwards Sapien 3 THV via the TF approach on 08/04/18. Post operative echo completed but  pending formal read. Groin sites are stable but right groin with mild hematoma. ECG with NSR and no high grade heart block. He has been placed on aspirin and plavix which he will continue x6 months and then drop the plavix and continue on a baby aspirin indefinitely. SBE prophylaxis discussed; I have RX'd amoxicillin.   He is returning to [place] on Monday. He has a virtual TOC appointment with Kerin Ransom PA-C next week.  I will see him back next month for an echo and virtual visit as he will be back in town for other reasons.   HLD: continue crestor  HTN: BP well controlled. Not on any antihypertensives. Continue to follow   Ectatic ascending thoracic aorta: found on pre TAVR CT. Follow up CTA/MRA recommended at 1 year.  _____________  Discharge Vitals Blood pressure (!) 131/53, pulse 70, temperature 98.5 F (36.9 C), temperature source Oral, resp. rate 18, height 5\' 10"  (1.778 m), weight 87.2  kg, SpO2 96 %.  Filed Weights   08/04/18 0720 08/05/18 0435  Weight: 87.2 kg 87.2 kg    Labs & Radiologic Studies    CBC Recent Labs    08/04/18 1149 08/05/18 0452  WBC  --  8.2  HGB 12.9* 12.9*  HCT 38.0* 37.2*  MCV  --  91.0  PLT  --  767*   Basic Metabolic Panel Recent Labs    08/04/18 1149 08/04/18 1153 08/05/18 0452  NA 142  --  140  K 4.2  --  3.7  CL  --   --  109  CO2  --   --  22  GLUCOSE  --   --  95  BUN  --   --  21  CREATININE  --  0.80 1.17  CALCIUM  --   --  8.6*  MG  --   --  1.7   Liver Function Tests No results for input(s): AST, ALT, ALKPHOS, BILITOT, PROT, ALBUMIN in the last 72 hours. No results for input(s): LIPASE, AMYLASE in the last 72 hours. Cardiac Enzymes No results for input(s): CKTOTAL, CKMB, CKMBINDEX, TROPONINI in the last 72 hours. BNP Invalid input(s): POCBNP D-Dimer No results for input(s): DDIMER in the last 72 hours. Hemoglobin A1C No results for input(s): HGBA1C in the last 72 hours. Fasting Lipid Panel No results for input(s): CHOL, HDL, LDLCALC, TRIG, CHOLHDL, LDLDIRECT in the last 72 hours. Thyroid Function Tests No results for input(s): TSH, T4TOTAL, T3FREE, THYROIDAB in the last 72 hours.  Invalid input(s): FREET3 _____________  Dg Chest 2 View  Result Date: 07/31/2018 CLINICAL DATA:  Pre-op respiratory exam for severe aortic stenosis. EXAM: CHEST - 2 VIEW COMPARISON:  None. FINDINGS: The heart size and mediastinal contours are within normal limits. Both lungs are clear. The visualized skeletal structures are unremarkable. IMPRESSION: No active cardiopulmonary disease. Electronically Signed   By: Earle Gell M.D.   On: 07/31/2018 16:44   Ct Coronary Morph W/cta Cor W/score W/ca W/cm &/or Wo/cm  Addendum Date: 07/17/2018   ADDENDUM REPORT: 07/17/2018 12:52 EXAM: OVER-READ INTERPRETATION  CT CHEST The following report is an over-read performed by radiologist Dr. Rebekah Chesterfield [facility] on 07/17/2018.  This over-read does not include interpretation of cardiac or coronary anatomy or pathology. The cardiac CTA interpretation by the cardiologist is attached. COMPARISON:  Chest CT 11/24/2012. FINDINGS: Extracardiac findings will be described separately under dictation for contemporaneously obtained CTA chest, abdomen and pelvis. IMPRESSION: Please see separate dictation for contemporaneously obtained CTA chest,  abdomen and pelvis 07/17/2018 for full description of relevant extracardiac findings. Electronically Signed   By: Vinnie Langton M.D.   On: 07/17/2018 12:52   Result Date: 07/17/2018 CLINICAL DATA:  Aortic Stenosis EXAM: Cardiac TAVR CT TECHNIQUE: The patient was scanned on a Siemens Force 536 slice scanner. A 120 kV retrospective scan was triggered in the ascending thoracic aorta at 140 HU's. Gantry rotation speed was 250 msecs and collimation was .6 mm. No beta blockade or nitro were given. The 3D data set was reconstructed in 5% intervals of the R-R cycle. Systolic and diastolic phases were analyzed on a dedicated work station using MPR, MIP and VRT modes. The patient received 80 cc of contrast. FINDINGS: Aortic Valve: Bicuspid and calcified with raphe between the right and left cusps Aorta: Mild atherosclerosis with ascending root aneurysm and bovine arch Sino-tubular Junction: 32 mm Ascending Thoracic Aorta: 45 mm Aortic Arch: 23 mm Descending Thoracic Aorta: 22 mm Sinus of Valsalva Measurements: Commissural Sinus: 32.6 mm Non Commissural Sinus: 37.3 mm Coronary Artery Height above Annulus: Left Main: 17.5 mm above annulus Right Coronary: 17 mm above annulus Virtual Basal Annulus Measurements: Maximum / Minimum Diameter: 30 mm x 25.9 mm Perimeter: 90 mm Area: 598 mm2 Coronary Arteries: Sufficient height above annulus for deployment Optimum Fluoroscopic Angle for Delivery: LAO 16 Caudal 15 degrees IMPRESSION: 1. Bicuspid AV with annular area of 598 mm2 suitable for a 29 mm Sapien 3 valve 2.  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  Coronary arteries sufficient height above annulus for deployment 4.  No LAA thrombus 5. Optimum angiographic angle for deployment LAO 16 Caudal 15 degrees Jenkins Rouge Electronically Signed: By: Jenkins Rouge M.D. On: 07/17/2018 11:34   Dg Chest Port 1 View  Result Date: 08/04/2018 CLINICAL DATA:  S/P TAVR (transcatheter aortic valve replacement) EXAM: PORTABLE CHEST 1 VIEW COMPARISON:  None. FINDINGS: Heart size and mediastinal contours are stable. Aortic valve replacement hardware appears appropriately positioned. Lungs are clear. No pleural effusion or pneumothorax seen. Osseous structures about the chest are unremarkable. IMPRESSION: Aortic valve replacement hardware in place. Lungs are clear. No evidence of procedural complicating feature. Electronically Signed   By: Franki Cabot M.D.   On: 08/04/2018 15:53   Ct Angio Chest Aorta W &/or Wo Contrast  Result Date: 07/17/2018 CLINICAL DATA:  73 year old male with history of severe aortic stenosis. Preprocedural study prior to potential transcatheter aortic valve replacement (TAVR) procedure. EXAM: CT ANGIOGRAPHY CHEST, ABDOMEN AND PELVIS TECHNIQUE: Multidetector CT imaging through the chest, abdomen and pelvis was performed using the standard protocol during bolus administration of intravenous contrast. Multiplanar reconstructed images and MIPs were obtained and reviewed to evaluate the vascular anatomy. CONTRAST:  186mL OMNIPAQUE IOHEXOL 350 MG/ML SOLN COMPARISON:  Chest CTA 11/24/2012. CT the abdomen and pelvis 12/26/2008. FINDINGS: CTA CHEST FINDINGS Cardiovascular: Heart size is normal. There is no significant pericardial fluid, thickening or pericardial calcification. There is aortic atherosclerosis, as well as atherosclerosis of the great vessels of the mediastinum and the coronary arteries, including calcified atherosclerotic plaque in the left anterior descending coronary artery. Ectasia of the ascending thoracic  aorta (4.1 cm in diameter). Mediastinum/Lymph Nodes: No pathologically enlarged mediastinal or hilar lymph nodes. Esophagus is unremarkable in appearance. No axillary lymphadenopathy. Lungs/Pleura: No suspicious appearing pulmonary nodules or masses are noted. No acute consolidative airspace disease. No pleural effusions. Musculoskeletal/Soft Tissues: There are no aggressive appearing lytic or blastic lesions noted in the visualized portions of the skeleton. CTA ABDOMEN AND  PELVIS FINDINGS Hepatobiliary: No suspicious cystic or solid hepatic lesions. No intra or extrahepatic biliary ductal dilatation. Gallbladder is normal in appearance. Pancreas: No pancreatic mass. No pancreatic ductal dilatation. No pancreatic or peripancreatic fluid or inflammatory changes. Spleen: Unremarkable. Adrenals/Urinary Tract: Bilateral kidneys and bilateral adrenal glands are normal in appearance. No hydroureteronephrosis. Urinary bladder is normal in appearance. Stomach/Bowel: Normal appearance of the stomach. No pathologic dilatation of small bowel or colon. Numerous colonic diverticulae are noted, without surrounding inflammatory changes to suggest an acute diverticulitis at this time. Normal appendix. Vascular/Lymphatic: Aortic atherosclerosis, with vascular findings and measurements pertinent to potential TAVR procedure, as detailed below. No aneurysm or dissection noted in the abdominal or pelvic vasculature. No lymphadenopathy noted in the abdomen or pelvis. Reproductive: Prostate gland and seminal vesicles are unremarkable in appearance. Other: No significant volume of ascites.  No pneumoperitoneum. Musculoskeletal: There are no aggressive appearing lytic or blastic lesions noted in the visualized portions of the skeleton. VASCULAR MEASUREMENTS PERTINENT TO TAVR: AORTA: Minimal Aortic Diameter-1.5 x 1.2 mm Severity of Aortic Calcification-mild RIGHT PELVIS: Right Common Iliac Artery - Minimal Diameter-8.6 x 9.0 mm  Tortuosity-mild Calcification-none Right External Iliac Artery - Minimal Diameter-8.3 x 8.4 mm Tortuosity-severe Calcification-minimal Right Common Femoral Artery - Minimal Diameter-8.4 x 6.6 mm Tortuosity-mild Calcification-mild LEFT PELVIS: Left Common Iliac Artery - Minimal Diameter-9.7 x 10.0 mm Tortuosity-mild Calcification-minimal Left External Iliac Artery - Minimal Diameter-8.6 x 8.0 mm Tortuosity-severe Calcification-none Left Common Femoral Artery - Minimal Diameter-8.5 x 7.2 mm Tortuosity-mild Calcification-none Review of the MIP images confirms the above findings. IMPRESSION: 1. Vascular findings and measurements pertinent to potential TAVR procedure, as detailed above. 2. Severe thickening calcification of the aortic valve, compatible with the reported clinical history of severe aortic stenosis. 3. Aortic atherosclerosis, in addition to left anterior descending coronary artery disease. In addition, there is ectasia of the ascending thoracic aorta (4.1 cm in diameter). Recommend annual imaging followup by CTA or MRA. This recommendation follows 2010 ACCF/AHA/AATS/ACR/ASA/SCA/SCAI/SIR/STS/SVM Guidelines for the Diagnosis and Management of Patients with Thoracic Aortic Disease. Circulation. 2010; 121: S505-L976. Aortic aneurysm NOS (ICD10-I71.9). 4. Colonic diverticulosis without evidence of acute diverticulitis at this time. 5. Additional incidental findings, as above. Electronically Signed   By: Vinnie Langton M.D.   On: 07/17/2018 14:31   Vas US Carotid  Result Date: 07/21/2018 Carotid Arterial Duplex Study Indications:  Pre tavr. Risk Factors: Hypertension, hyperlipidemia. Performing Technologist: Abram Sander RVS  Examination Guidelines: A complete evaluation includes B-mode imaging, spectral Doppler, color Doppler, and power Doppler as needed of all accessible portions of each vessel. Bilateral testing is considered an integral part of a complete examination. Limited examinations for reoccurring  indications may be performed as noted.  Right Carotid Findings: +----------+--------+--------+--------+-----------+--------+             PSV cm/s EDV cm/s Stenosis Describe    Comments  +----------+--------+--------+--------+-----------+--------+  CCA Prox   91       11                homogeneous           +----------+--------+--------+--------+-----------+--------+  CCA Distal 76       17                homogeneous           +----------+--------+--------+--------+-----------+--------+  ICA Prox   60       21       1-39%    homogeneous           +----------+--------+--------+--------+-----------+--------+  ICA Distal 85       28                                      +----------+--------+--------+--------+-----------+--------+  ECA        80       6                                       +----------+--------+--------+--------+-----------+--------+ +----------+--------+-------+--------+-------------------+             PSV cm/s EDV cms Describe Arm Pressure (mmHG)  +----------+--------+-------+--------+-------------------+  Subclavian 119                                            +----------+--------+-------+--------+-------------------+ +---------+--------+--+--------+--+---------+  Vertebral PSV cm/s 52 EDV cm/s 19 Antegrade  +---------+--------+--+--------+--+---------+  Left Carotid Findings: +----------+--------+--------+--------+-----------+--------+             PSV cm/s EDV cm/s Stenosis Describe    Comments  +----------+--------+--------+--------+-----------+--------+  CCA Prox   112      21                homogeneous           +----------+--------+--------+--------+-----------+--------+  CCA Distal 80       21                homogeneous           +----------+--------+--------+--------+-----------+--------+  ICA Prox   103      35       1-39%    homogeneous           +----------+--------+--------+--------+-----------+--------+  ICA Distal 88       29                                       +----------+--------+--------+--------+-----------+--------+  ECA        64       7                                       +----------+--------+--------+--------+-----------+--------+ +----------+--------+--------+--------+-------------------+  Subclavian PSV cm/s EDV cm/s Describe Arm Pressure (mmHG)  +----------+--------+--------+--------+-------------------+             80                                              +----------+--------+--------+--------+-------------------+ +---------+--------+--+--------+--+---------+  Vertebral PSV cm/s 50 EDV cm/s 12 Antegrade  +---------+--------+--+--------+--+---------+  Summary: Right Carotid: Velocities in the right ICA are consistent with a 1-39% stenosis. Left Carotid: Velocities in the left ICA are consistent with a 1-39% stenosis. Vertebrals: Bilateral vertebral arteries demonstrate antegrade flow. *See table(s) above for measurements and observations.  Electronically signed by Deitra Mayo MD on 07/21/2018 at 12:53:43 PM.    Final    Ct Angio Abd/pel W/ And/or W/o  Result Date: 07/17/2018 CLINICAL DATA:  73 year old male with history of severe aortic stenosis. Preprocedural study  prior to potential transcatheter aortic valve replacement (TAVR) procedure. EXAM: CT ANGIOGRAPHY CHEST, ABDOMEN AND PELVIS TECHNIQUE: Multidetector CT imaging through the chest, abdomen and pelvis was performed using the standard protocol during bolus administration of intravenous contrast. Multiplanar reconstructed images and MIPs were obtained and reviewed to evaluate the vascular anatomy. CONTRAST:  175mL OMNIPAQUE IOHEXOL 350 MG/ML SOLN COMPARISON:  Chest CTA 11/24/2012. CT the abdomen and pelvis 12/26/2008. FINDINGS: CTA CHEST FINDINGS Cardiovascular: Heart size is normal. There is no significant pericardial fluid, thickening or pericardial calcification. There is aortic atherosclerosis, as well as atherosclerosis of the great vessels of the mediastinum and the coronary  arteries, including calcified atherosclerotic plaque in the left anterior descending coronary artery. Ectasia of the ascending thoracic aorta (4.1 cm in diameter). Mediastinum/Lymph Nodes: No pathologically enlarged mediastinal or hilar lymph nodes. Esophagus is unremarkable in appearance. No axillary lymphadenopathy. Lungs/Pleura: No suspicious appearing pulmonary nodules or masses are noted. No acute consolidative airspace disease. No pleural effusions. Musculoskeletal/Soft Tissues: There are no aggressive appearing lytic or blastic lesions noted in the visualized portions of the skeleton. CTA ABDOMEN AND PELVIS FINDINGS Hepatobiliary: No suspicious cystic or solid hepatic lesions. No intra or extrahepatic biliary ductal dilatation. Gallbladder is normal in appearance. Pancreas: No pancreatic mass. No pancreatic ductal dilatation. No pancreatic or peripancreatic fluid or inflammatory changes. Spleen: Unremarkable. Adrenals/Urinary Tract: Bilateral kidneys and bilateral adrenal glands are normal in appearance. No hydroureteronephrosis. Urinary bladder is normal in appearance. Stomach/Bowel: Normal appearance of the stomach. No pathologic dilatation of small bowel or colon. Numerous colonic diverticulae are noted, without surrounding inflammatory changes to suggest an acute diverticulitis at this time. Normal appendix. Vascular/Lymphatic: Aortic atherosclerosis, with vascular findings and measurements pertinent to potential TAVR procedure, as detailed below. No aneurysm or dissection noted in the abdominal or pelvic vasculature. No lymphadenopathy noted in the abdomen or pelvis. Reproductive: Prostate gland and seminal vesicles are unremarkable in appearance. Other: No significant volume of ascites.  No pneumoperitoneum. Musculoskeletal: There are no aggressive appearing lytic or blastic lesions noted in the visualized portions of the skeleton. VASCULAR MEASUREMENTS PERTINENT TO TAVR: AORTA: Minimal Aortic  Diameter-1.5 x 1.2 mm Severity of Aortic Calcification-mild RIGHT PELVIS: Right Common Iliac Artery - Minimal Diameter-8.6 x 9.0 mm Tortuosity-mild Calcification-none Right External Iliac Artery - Minimal Diameter-8.3 x 8.4 mm Tortuosity-severe Calcification-minimal Right Common Femoral Artery - Minimal Diameter-8.4 x 6.6 mm Tortuosity-mild Calcification-mild LEFT PELVIS: Left Common Iliac Artery - Minimal Diameter-9.7 x 10.0 mm Tortuosity-mild Calcification-minimal Left External Iliac Artery - Minimal Diameter-8.6 x 8.0 mm Tortuosity-severe Calcification-none Left Common Femoral Artery - Minimal Diameter-8.5 x 7.2 mm Tortuosity-mild Calcification-none Review of the MIP images confirms the above findings. IMPRESSION: 1. Vascular findings and measurements pertinent to potential TAVR procedure, as detailed above. 2. Severe thickening calcification of the aortic valve, compatible with the reported clinical history of severe aortic stenosis. 3. Aortic atherosclerosis, in addition to left anterior descending coronary artery disease. In addition, there is ectasia of the ascending thoracic aorta (4.1 cm in diameter). Recommend annual imaging followup by CTA or MRA. This recommendation follows 2010 ACCF/AHA/AATS/ACR/ASA/SCA/SCAI/SIR/STS/SVM Guidelines for the Diagnosis and Management of Patients with Thoracic Aortic Disease. Circulation. 2010; 121: Y782-N562. Aortic aneurysm NOS (ICD10-I71.9). 4. Colonic diverticulosis without evidence of acute diverticulitis at this time. 5. Additional incidental findings, as above. Electronically Signed   By: Vinnie Langton M.D.   On: 07/17/2018 14:31   Disposition   Pt is being discharged home today in good condition.  Follow-up Plans & Appointments  Follow-up Information    Erlene Quan, PA-C. Go on 08/12/2018.   Specialties: Cardiology, Radiology Why: @ 3:15pm for a VIRTURAL VISIT. Please review consent and information about virtual visits in your discharge  paperwork Contact information: [place] [address] [phone]            Discharge Medications   Allergies as of 08/05/2018      Reactions   Lipitor [atorvastatin] Other (See Comments)   Myalgias   Demerol [meperidine] Other (See Comments)   HOT/AGITATED      Medication List    STOP taking these medications   naproxen sodium 220 MG tablet Commonly known as: ALEVE     TAKE these medications   amoxicillin 500 MG tablet Commonly known as: AMOXIL Take 4 tablets (2,000 mg total) by mouth as directed. 1 hour before dental work, including cleanings   aspirin EC 81 MG tablet Take 81 mg by mouth every evening.   clopidogrel 75 MG tablet Commonly known as: PLAVIX Take 1 tablet (75 mg total) by mouth daily with breakfast. Start taking on: August 06, 2018   COSAMIN DS PO Take 1 tablet by mouth daily.   Crestor 10 MG tablet Generic drug: rosuvastatin TAKE 1 TABLET BY MOUTH EVERY OTHER DAY What changed: how much to take   multivitamin with minerals Tabs tablet Take 1 tablet by mouth daily.         Outstanding Labs/Studies   None   Duration of Discharge Encounter   Greater than 30 minutes including physician time.  SignedAngelena Form, PA-C 08/05/2018, 11:25 AM [phone]

## 2018-08-05 NOTE — Discharge Instructions (Signed)
Try to avoid NSAID use (ie naproxin, ibuprofen) while on aspirin and plavix as this can increase your risk of bleeding.  ACTIVITY AND EXERCISE  Daily activity and exercise are an important part of your recovery. People recover at different rates depending on their general health and type of valve procedure.  Most people recovering from TAVR feel better relatively quickly   No lifting, pushing, pulling more than 10 pounds (examples to avoid: groceries, vacuuming, gardening, golfing):             - For one week with a procedure through the groin.             - For six weeks for procedures through the chest wall or neck NOTE: You will typically see one of our providers 7-14 days after your procedure to discuss [place] the above activities.      DRIVING  Do not drive for until you are seen for follow up and cleared by a provider. Generally, we ask patient to not drive for 1 week after their procedure.  If you have been told by your doctor in the past that you may not drive, you must talk with him/her before you begin driving again.     DRESSING  Groin site: you may leave the clear dressing over the site for up to one week or until it falls off.     HYGIENE  If you had a femoral (leg) procedure, you may take a shower when you return home. After the shower, pat the site dry. Do NOT use powder, oils or lotions in your groin area until the site has completely healed.  If you had a chest procedure, you may shower when you return home unless specifically instructed not to by your discharging practitioner.             - DO NOT scrub incision; pat dry with a towel             - DO NOT apply any lotions, oils, powders to the incision             - No tub baths / swimming for at least 2 weeks.  If you notice any fevers, chills, increased pain, swelling, bleeding or pus, please contact your doctor.   ADDITIONAL INFORMATION  If you are going to have an upcoming dental procedure, please  contact our office as you will require antibiotics ahead of time to prevent infection on your heart valve.    If you have any questions or concerns you can call the structural heart phone during normal business hours 8am-4pm. If you have an urgent need after hours or weekends please call [phone] to talk to the on call provider for general cardiology. If you have an emergency that requires immediate attention, please call 911.    After TAVR Checklist  Check  Test Description   Follow up appointment in 1-2 weeks  You will see our structural heart physician assistant, Nell Range. Your incision sites will be checked and you will be cleared to drive and resume all normal activities if you are doing well.     1 month echo and follow up  You will have an echo to check on your new heart valve and be seen back in the office by Nell Range. Many times the echo is not read by your appointment time, but Joellen Jersey will call you later that day or the following day to report your results.   Follow up  with your primary cardiologist You will need to be seen by your primary cardiologist in the following 3-6 months after your 1 month appointment in the valve clinic. Often times your Plavix or Aspirin will be discontinued during this time, but this is decided on a case by case basis.    1 year echo and follow up You will have another echo to check on your heart valve after 1 year and be seen back in the office by Nell Range. This your last structural heart visit.   Bacterial endocarditis prophylaxis  You will have to take antibiotics for the rest of your life before all dental procedures (even teeth cleanings) to protect your heart valve. Antibiotics are also required before some surgeries. Please check with your cardiologist before scheduling any surgeries. Also, please make sure to tell us if you have a penicillin allergy as you will require an alternative antibiotic.       HEART AND VASCULAR CENTER     MULTIDISCIPLINARY HEART VALVE TEAM   YOUR CARDIOLOGY TEAM HAS ARRANGED FOR AN E-VISIT FOR YOUR APPOINTMENT - PLEASE REVIEW IMPORTANT INFORMATION BELOW SEVERAL DAYS PRIOR TO YOUR APPOINTMENT  Due to the recent COVID-19 pandemic, we are transitioning in-person office visits to tele-medicine visits in an effort to decrease unnecessary exposure to our patients, their families, and staff. These visits are billed to your insurance just like a normal visit is. We also encourage you to sign up for MyChart if you have not already done so. You will need a smartphone if possible. For patients that do not have this, we can still complete the visit using a regular telephone but do prefer a smartphone to enable video when possible. You may have a family member that lives with you that can help. If possible, we also ask that you have a blood pressure cuff and scale at home to measure your blood pressure, heart rate and weight prior to your scheduled appointment. Patients with clinical needs that need an in-person evaluation and testing will still be able to come to the office if absolutely necessary. If you have any questions, feel free to call our office.    YOUR PROVIDER WILL BE USING THE FOLLOWING PLATFORM TO COMPLETE YOUR VISIT: [email or web address] All you need is a smart phone. You will receive a text message from the provider through the [email or web address] app. You will follow the prompts and it will take you to a virtual visit with your provider. Please check your blood pressure, heart rate and weight prior to your scheduled appointment.  CONSENT FOR TELE-HEALTH VISIT - PLEASE REVIEW  I hereby voluntarily request, consent and authorize [facility] and its employed or contracted physicians, physician assistants, nurse practitioners or other licensed health care professionals (the Practitioner), to provide me with telemedicine health care services (the Services") as deemed necessary by the treating Practitioner. I acknowledge  and consent to receive the Services by the Practitioner via telemedicine. I understand that the telemedicine visit will involve communicating with the Practitioner through live audiovisual communication technology and the disclosure of certain medical information by electronic transmission. I acknowledge that I have been given the opportunity to request an in-person assessment or other available alternative prior to the telemedicine visit and am voluntarily participating in the telemedicine visit.  I understand that I have the right to withhold or withdraw my consent to the use of telemedicine in the course of my care at any time, without affecting my right to future care or treatment, and that the  Practitioner or I may terminate the telemedicine visit at any time. I understand that I have the right to inspect all information obtained and/or recorded in the course of the telemedicine visit and may receive copies of available information for a reasonable fee.  I understand that some of the potential risks of receiving the Services via telemedicine include:   Delay or interruption in medical evaluation due to technological equipment failure or disruption;  Information transmitted may not be sufficient (e.g. poor resolution of images) to allow for appropriate medical decision making by the Practitioner; and/or   In rare instances, security protocols could fail, causing a breach of personal health information.  Furthermore, I acknowledge that it is my responsibility to provide information about my medical history, conditions and care that is complete and accurate to the best of my ability. I acknowledge that Practitioner's advice, recommendations, and/or decision may be based on factors not within their control, such as incomplete or inaccurate data provided by me or distortions of diagnostic images or specimens that may result from electronic transmissions. I understand that the practice of medicine is not an  exact science and that Practitioner makes no warranties or guarantees regarding treatment outcomes. I acknowledge that I will receive a copy of this consent concurrently upon execution via email to the email address I last provided but may also request a printed copy by calling the office of [place].    I understand that my insurance will be billed for this visit.   I have read or had this consent read to me.  I understand the contents of this consent, which adequately explains the benefits and risks of the Services being provided via telemedicine.   I have been provided ample opportunity to ask questions regarding this consent and the Services and have had my questions answered to my satisfaction.  I give my informed consent for the services to be provided through the use of telemedicine in my medical care  By participating in this telemedicine visit I agree to the above.

## 2018-08-05 NOTE — Progress Notes (Signed)
Pt ambulated to [facility] this am. Felt well, no groin issues. Discussed walking at home and moving back to his normal exercise. Discussed restrictions. Discussed CRPII but understandably not interested. Left HH diet.  [place] CES, ACSM 8:38 AM 08/05/2018

## 2018-08-05 NOTE — Progress Notes (Signed)
1 Day Post-Op Procedure(s) (LRB): TRANSCATHETER AORTIC VALVE REPLACEMENT, TRANSFEMORAL (N/A) TRANSESOPHAGEAL ECHOCARDIOGRAM (TEE) (N/A) Subjective: No complaints. Ambulating well  Objective: Vital signs in last 24 hours: Temp:  [97.4 F (36.3 C)-98.5 F (36.9 C)] 98.5 F (36.9 C) (06/17 0821) Pulse Rate:  [51-224] 70 (06/17 0821) Cardiac Rhythm: Normal sinus rhythm (06/17 0704) Resp:  [0-25] 18 (06/17 0821) BP: (103-167)/(50-85) 131/53 (06/17 0821) SpO2:  [96 %-100 %] 96 % (06/17 0821) Weight:  [87.2 kg] 87.2 kg (06/17 0435)  Hemodynamic parameters for last 24 hours:    Intake/Output from previous day: 06/16 0701 - 06/17 0700 In: 2040.1 [P.O.:360; I.V.:1461.6; IV Piggyback:218.4] Out: 225 [Urine:200; Blood:25] Intake/Output this shift: Total I/O In: 118 [P.O.:118] Out: -   General appearance: alert and cooperative Neurologic: intact Heart: regular rate and rhythm, S1, S2 normal, no murmur Lungs: clear to auscultation bilaterally Extremities: extremities normal, palpable pedal pulses Wound: groin sites look good  Lab Results: Recent Labs    08/04/18 1149 08/05/18 0452  WBC  --  8.2  HGB 12.9* 12.9*  HCT 38.0* 37.2*  PLT  --  126*   BMET:  Recent Labs    08/04/18 1149 08/04/18 1153 08/05/18 0452  NA 142  --  140  K 4.2  --  3.7  CL  --   --  109  CO2  --   --  22  GLUCOSE  --   --  95  BUN  --   --  21  CREATININE  --  0.80 1.17  CALCIUM  --   --  8.6*    PT/INR: No results for input(s): LABPROT, INR in the last 72 hours. ABG    Component Value Date/Time   PHART 7.357 08/04/2018 1149   HCO3 24.3 08/04/2018 1149   TCO2 26 08/04/2018 1149   ACIDBASEDEF 1.0 08/04/2018 1149   O2SAT 100.0 08/04/2018 1149   CBG (last 3)  Recent Labs    08/04/18 1207  GLUCAP 82   ECG: sinus with non-specific intra-ventricular conduction delay.  Assessment/Plan: S/P Procedure(s) (LRB): TRANSCATHETER AORTIC VALVE REPLACEMENT, TRANSFEMORAL (N/A) TRANSESOPHAGEAL  ECHOCARDIOGRAM (TEE) (N/A)  POD 1 Hemodynamically stable in sinus rhythm. No significant change from preop ECG. Echo pending this am. Plan to discharge today to hotel and he will stay in [place] until Monday. 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   LOS: 1 day    Gaye Pollack 08/05/2018

## 2018-08-06 ENCOUNTER — Telehealth: Payer: Self-pay | Admitting: Physician Assistant

## 2018-08-06 ENCOUNTER — Telehealth: Payer: Self-pay | Admitting: Cardiology

## 2018-08-06 NOTE — Telephone Encounter (Signed)
  [place] VALVE TEAM   Patient contacted regarding discharge from [facility] on 08/05/18  Patient understands to follow up with provider Kerin Ransom PA-C next week as a virtual TOC. Patient understands discharge instructions? yes Patient understands medications and regiment? yes Patient understands to bring all medications to this visit? yes   He is doing great. Has all his medications including aspirin, plavix and amoxicillin for SBE prophylaxis.   Angelena Form PA-C  [facility]

## 2018-08-06 NOTE — Telephone Encounter (Signed)
Mychart, smartphone, consent, pre reg complete 08/06/18 AF °

## 2018-08-12 ENCOUNTER — Telehealth: Payer: Medicare Other | Admitting: Cardiology

## 2018-08-12 NOTE — Telephone Encounter (Signed)
I called patient to confirm 08-14-18 appointment with Kerin Ransom

## 2018-08-13 NOTE — Telephone Encounter (Signed)
This encounter was created in error - please disregard.

## 2018-08-14 ENCOUNTER — Telehealth (INDEPENDENT_AMBULATORY_CARE_PROVIDER_SITE_OTHER): Payer: Medicare Other | Admitting: Cardiology

## 2018-08-14 ENCOUNTER — Other Ambulatory Visit: Payer: Self-pay

## 2018-08-14 ENCOUNTER — Telehealth: Payer: Self-pay

## 2018-08-14 ENCOUNTER — Encounter: Payer: Self-pay | Admitting: Cardiology

## 2018-08-14 VITALS — BP 131/76 | HR 72 | Ht 70.0 in | Wt 190.0 lb

## 2018-08-14 DIAGNOSIS — E782 Mixed hyperlipidemia: Secondary | ICD-10-CM | POA: Diagnosis not present

## 2018-08-14 DIAGNOSIS — Z952 Presence of prosthetic heart valve: Secondary | ICD-10-CM | POA: Diagnosis not present

## 2018-08-14 DIAGNOSIS — I10 Essential (primary) hypertension: Secondary | ICD-10-CM | POA: Diagnosis not present

## 2018-08-14 NOTE — Telephone Encounter (Signed)
Contacted patient to discuss AVS Instructions. Gave him Luke's recommendations from today's virtual office visit. Informed patient that he should contact the office if and when he needs to. He voiced understanding and AVS mailed to patient.

## 2018-08-14 NOTE — Progress Notes (Signed)
Virtual Visit via Video Note   This visit type was conducted due to national recommendations for restrictions regarding the COVID-19 Pandemic (e.g. social distancing) in an effort to limit this patient's exposure and mitigate transmission in our community.  Due to his co-morbid illnesses, this patient is at least at moderate risk for complications without adequate follow up.  This format is felt to be most appropriate for this patient at this time.  All issues noted in this document were discussed and addressed.  A limited physical exam was performed with this format.  Please refer to the patient's chart for his consent to telehealth for [facility].   Date:  08/14/2018   ID:  Sean Abbott, DOB 08-20-1945, MRN [id number]  Patient Location: Home Provider Location: Office  PCP:  Default, Provider, MD  Cardiologist:  Dr Gwenlyn Found Electrophysiologist:  None   Evaluation Performed:  Follow-Up Visit post TAVR  History of Present Illness:    Sean Abbott is a 73 y.o. male with a history of a bicuspid aortic valve.  He splits time between [place] and [place].  He is active and plays golf several times a week.  He had noticed some fatigue on the back 9.  He saw cardiologist in [place] and an echocardiogram showed severe aortic stenosis.  He had seen Dr. Gwenlyn Found in the past and came to [facility] for further evaluation.  Diagnostic catheterization done in July 31, 2018 showed normal coronaries and severe AS.  On August 04, 2018 he underwent TAVR.  This was uncomplicated.  He was contacted today for a virtual visit for follow-up.    He is back in [place].  He did have some concerns.  He has some ecchymosis of his right forearm.  I was able to see what appeared to be old ecchymosis of his right forearm, the video quality was not great.  He is not had any red streaks or heat at the site.  He tells me is a little sore, I reassured him that if he had some bruising at the site it should  eventually resolve but to be aware and let us know if any signs of infection should occur.  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  Since his surgery he has had a couple episodes.  He asked if it had anything to do with his blood pressure after the valve surgery.  I suggested it was probably more from the stress of the surgery then any blood pressure changes.  His blood pressure was read as 130/82.  He will monitor his blood pressure at home, I explained that if his blood pressures consistently above 956 systolic or 85 diastolic that we would probably start him on an antihypertensive, amlodipine would probably be a good choice.  He asked whether he could play golf, apparently Dr. Mohammed Kindle has already cleared him for this.  I suggested he be careful and avoid excessive heat.  He asked whether he could resume sexual activity, from a cardiac standpoint I do not see a reason why he cannot but I asked him to just check with Dr. Mohammed Kindle about this.  The patient does not have symptoms concerning for COVID-19 infection (fever, chills, cough, or new shortness of breath).    Past Medical History:  Diagnosis Date  . 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 Hyperlipidemia   . Hypertension   . S/P TAVR (transcatheter aortic valve replacement)   . Severe aortic  stenosis   . Skin cancer   . Thoracic aortic aneurysm [facility])    Past Surgical History:  Procedure Laterality Date  . CARDIAC CATHETERIZATION  12/05/2005   Normal coronary arteries, rule out thoracic aortic aneurysm  . MOUTH SURGERY  2003  . RIGHT HEART CATH AND CORONARY ANGIOGRAPHY N/A 07/20/2018   Procedure: RIGHT HEART CATH AND CORONARY ANGIOGRAPHY;  Surgeon: Lorretta Harp, MD;  Location: [place] CV LAB;  Service: Cardiovascular;  Laterality: N/A;  . TEE WITHOUT CARDIOVERSION N/A 08/04/2018   Procedure: TRANSESOPHAGEAL ECHOCARDIOGRAM (TEE);  Surgeon: Sherren Mocha, MD;  Location: [place] CV LAB;   Service: Open Heart Surgery;  Laterality: N/A;  . TENDON REPAIR Left 2005   [place]  . TRANSCATHETER AORTIC VALVE REPLACEMENT, TRANSFEMORAL  08/04/2018  . TRANSCATHETER AORTIC VALVE REPLACEMENT, TRANSFEMORAL N/A 08/04/2018   Procedure: TRANSCATHETER AORTIC VALVE REPLACEMENT, TRANSFEMORAL;  Surgeon: Sherren Mocha, MD;  Location: [place] CV LAB;  Service: Open Heart Surgery;  Laterality: N/A;     Current Meds  Medication Sig  . aspirin EC 81 MG tablet Take 81 mg by mouth every evening.   . clopidogrel (PLAVIX) 75 MG tablet Take 1 tablet (75 mg total) by mouth daily with breakfast.  . CRESTOR 10 MG tablet TAKE 1 TABLET BY MOUTH EVERY OTHER DAY (Patient taking differently: Take 10 mg by mouth every other day. )  . Glucosamine-Chondroitin (COSAMIN DS PO) Take 1 tablet by mouth daily.  . Multiple Vitamin (MULTIVITAMIN WITH MINERALS) TABS tablet Take 1 tablet by mouth daily.     Allergies:   Lipitor [atorvastatin] and Demerol [meperidine]   Social History   Tobacco Use  . Smoking status: Never Smoker  . Smokeless tobacco: Never Used  Substance Use Topics  . Alcohol use: Yes    Alcohol/week: 1.0 standard drinks    Types: 1 Glasses of wine per week    Comment: every night  . Drug use: Never     Family Hx: The patient's family history includes Diabetes in his father; Heart disease in his father; Hypertension in his father.  ROS:   Please see the history of present illness.    All other systems reviewed and are negative.   Prior CV studies:   The following studies were reviewed today:   Labs/Other Tests and Data Reviewed:    EKG:  No ECG reviewed.  Recent Labs: 07/31/2018: ALT 7; B Natriuretic Peptide 60.0 08/05/2018: BUN 21; Creatinine, Ser 1.17; Hemoglobin 12.9; Magnesium 1.7; Platelets 126; Potassium 3.7; Sodium 140   Recent Lipid Panel No results found for: CHOL, TRIG, HDL, CHOLHDL, LDLCALC, LDLDIRECT  Wt Readings from Last 3 Encounters:  08/14/18 190 lb (86.2 kg)   08/05/18 192 lb 3.9 oz (87.2 kg)  07/31/18 192 lb 3.2 oz (87.2 kg)     Objective:    Vital Signs:  BP 131/76   Pulse 72   Ht 5\' 10"  (1.778 m)   Wt 190 lb (86.2 kg)   SpO2 96%   BMI 27.26 kg/m    VITAL SIGNS:  reviewed No distress on video interview Old ecchymosis Rt forearm  ASSESSMENT & PLAN:    AS- S/p TAVR 08/04/2018  HTN- He was not on medication pre op.  He will monitor his B/P at home and contact his cardiologist in [facility] with the results.  HLD- He is on 3 times a week Crestor.  COVID-19 Education: The signs and symptoms of COVID-19 were discussed with the patient and how to seek care for  testing (follow up with PCP or arrange E-visit).  The importance of social distancing was discussed today.  Time:   Today, I have spent 20 minutes with the patient with telehealth technology discussing the above problems.     Medication Adjustments/Labs and Tests Ordered: Current medicines are reviewed at length with the patient today.  Concerns regarding medicines are outlined above.   Tests Ordered: No orders of the defined types were placed in this encounter.   Medication Changes: No orders of the defined types were placed in this encounter.   Follow Up:  In Person When he returns to [place].  Keep f/u with Dr Cyndia Bent as scheduled.   Signed, Kerin Ransom, PA-C  08/14/2018 8:25 AM    [facility]

## 2018-08-14 NOTE — Patient Instructions (Addendum)
Medication Instructions:  Your physician recommends that you continue on your current medications as directed. Please refer to the Current Medication list given to you today. If you need a refill on your cardiac medications before your next appointment, please call your pharmacy.   Lab work: None  If you have labs (blood work) drawn today and your tests are completely normal, you will receive your results only by: [name] MyChart Message (if you have MyChart) OR . A paper copy in the mail If you have any lab test that is abnormal or we need to change your treatment, we will call you to review the results.  Testing/Procedures: None   Follow-Up: At [facility], you and your health needs are our priority.  As part of our continuing mission to provide you with exceptional heart care, we have created designated Provider Care Teams.  These Care Teams include your primary Cardiologist (physician) and Advanced Practice Providers (APPs -  Physician Assistants and Nurse Practitioners) who all work together to provide you with the care you need, when you need it. [name] YOUR PROVIDER, Kerin Ransom, PA-C RECOMMENDS YOU FOLLOW UP AS NEEDED.  Any Other Special Instructions Will Be Listed Below (If Applicable).

## 2018-08-17 ENCOUNTER — Encounter: Payer: Self-pay | Admitting: Thoracic Surgery (Cardiothoracic Vascular Surgery)

## 2018-08-20 NOTE — Telephone Encounter (Signed)
VERBAL CONSENT GIVEN TO ASHLEY FEATHERSTONE ON 08/06/2018.     Virtual Visit Pre-Appointment Phone Call  "MR Gaines, I am calling you today to discuss your upcoming appointment. We are currently trying to limit exposure to the virus that causes COVID-19 by seeing patients at home rather than in the office."  1. "What is the BEST phone number to call the day of the visit?" - include this in appointment notes  2. "Do you have or have access to (through a family member/friend) a smartphone with video capability that we can use for your visit?" a. If yes - list this number in appt notes as "cell" (if different from BEST phone #) and list the appointment type as a VIDEO visit in appointment notes b. If no - list the appointment type as a PHONE visit in appointment notes  3. Confirm consent - "In the setting of the current Covid19 crisis, you are scheduled for a (phone or video) visit with your provider on (date) at (time).  Just as we do with many in-office visits, in order for you to participate in this visit, we must obtain consent.  If you'd like, I can send this to your mychart (if signed up) or email for you to review.  Otherwise, I can obtain your verbal consent now.  All virtual visits are billed to your insurance company just like a normal visit would be.  By agreeing to a virtual visit, we'd like you to understand that the technology does not allow for your provider to perform an examination, and thus may limit your provider's ability to fully assess your condition. If your provider identifies any concerns that need to be evaluated in person, we will make arrangements to do so.  Finally, though the technology is pretty good, we cannot assure that it will always work on either your or our end, and in the setting of a video visit, we may have to convert it to a phone-only visit.  In either situation, we cannot ensure that we have a secure connection.  Are you willing to proceed?" STAFF: Did the patient  verbally acknowledge consent to telehealth visit? Document YES/NO here: YES  4. Advise patient to be prepared - "Two hours prior to your appointment, go ahead and check your blood pressure, pulse, oxygen saturation, and your weight (if you have the equipment to check those) and write them all down. When your visit starts, your provider will ask you for this information. If you have an Apple Watch or Kardia device, please plan to have heart rate information ready on the day of your appointment. Please have a pen and paper handy nearby the day of the visit as well."  5. Give patient instructions for MyChart download to smartphone OR Doximity/Doxy.me as below if video visit (depending on what platform provider is using)  6. Inform patient they will receive a phone call 15 minutes prior to their appointment time (may be from unknown caller ID) so they should be prepared to answer    TELEPHONE CALL NOTE  Sean Abbott has been deemed a candidate for a follow-up tele-health visit to limit community exposure during the Covid-19 pandemic. I spoke with the patient via phone to ensure availability of phone/video source, confirm preferred email & phone number, and discuss instructions and expectations.  I reminded Sean Abbott to be prepared with any vital sign and/or heart rhythm information that could potentially be obtained via home monitoring, at the time of his visit. I  reminded Sean Abbott to expect a phone call prior to his visit.  Harold Hedge, CMA 08/20/2018 4:01 PM   INSTRUCTIONS FOR DOWNLOADING THE MYCHART APP TO SMARTPHONE  - The patient must first make sure to have activated MyChart and know their login information - If Apple, go to [employer] and type in MyChart in the search bar and download the app. If Android, ask patient to go to [employer] and type in [place] in the search bar and download the app. The app is free but as with any other app downloads, their phone may  require them to verify saved payment information or Apple/Android password.  - The patient will need to then log into the app with their MyChart username and password, and select [facility] as their healthcare provider to link the account. When it is time for your visit, go to the MyChart app, find appointments, and click Begin Video Visit. Be sure to Select Allow for your device to access the Microphone and Camera for your visit. You will then be connected, and your provider will be with you shortly.  **If they have any issues connecting, or need assistance please contact MyChart service desk [phone])**  **If using a computer, in order to ensure the best quality for their visit they will need to use either of the following Internet Browsers: [employer], or Google Chrome**  IF USING DOXIMITY or DOXY.ME - The patient will receive a link just prior to their visit by text.     FULL LENGTH CONSENT FOR TELE-HEALTH VISIT   I hereby voluntarily request, consent and authorize [facility] and its employed or contracted physicians, physician assistants, nurse practitioners or other licensed health care professionals (the Practitioner), to provide me with telemedicine health care services (the "Services") as deemed necessary by the treating Practitioner. I acknowledge and consent to receive the Services by the Practitioner via telemedicine. I understand that the telemedicine visit will involve communicating with the Practitioner through live audiovisual communication technology and the disclosure of certain medical information by electronic transmission. I acknowledge that I have been given the opportunity to request an in-person assessment or other available alternative prior to the telemedicine visit and am voluntarily participating in the telemedicine visit.  I understand that I have the right to withhold or withdraw my consent to the use of telemedicine in the course of my care at  any time, without affecting my right to future care or treatment, and that the Practitioner or I may terminate the telemedicine visit at any time. I understand that I have the right to inspect all information obtained and/or recorded in the course of the telemedicine visit and may receive copies of available information for a reasonable fee.  I understand that some of the potential risks of receiving the Services via telemedicine include:  [name] Delay or interruption in medical evaluation due to technological equipment failure or disruption; . Information transmitted may not be sufficient (e.g. poor resolution of images) to allow for appropriate medical decision making by the Practitioner; and/or  . In rare instances, security protocols could fail, causing a breach of personal health information.  Furthermore, I acknowledge that it is my responsibility to provide information about my medical history, conditions and care that is complete and accurate to the best of my ability. I acknowledge that Practitioner's advice, recommendations, and/or decision may be based on factors not within their control, such as incomplete or inaccurate data provided by me or distortions  of diagnostic images or specimens that may result from electronic transmissions. I understand that the practice of medicine is not an exact science and that Practitioner makes no warranties or guarantees regarding treatment outcomes. I acknowledge that I will receive a copy of this consent concurrently upon execution via email to the email address I last provided but may also request a printed copy by calling the office of [facility].    I understand that my insurance will be billed for this visit.   I have read or had this consent read to me. . I understand the contents of this consent, which adequately explains the benefits and risks of the Services being provided via telemedicine.  . I have been provided ample opportunity to ask questions  regarding this consent and the Services and have had my questions answered to my satisfaction. . I give my informed consent for the services to be provided through the use of telemedicine in my medical care  By participating in this telemedicine visit I agree to the above.

## 2018-08-27 ENCOUNTER — Other Ambulatory Visit (HOSPITAL_COMMUNITY): Payer: Medicare Other

## 2018-08-27 ENCOUNTER — Ambulatory Visit: Payer: Medicare Other | Admitting: Physician Assistant

## 2018-08-28 ENCOUNTER — Ambulatory Visit (HOSPITAL_COMMUNITY): Payer: Medicare Other | Attending: Cardiology

## 2018-08-28 ENCOUNTER — Other Ambulatory Visit: Payer: Self-pay

## 2018-08-28 DIAGNOSIS — Z952 Presence of prosthetic heart valve: Secondary | ICD-10-CM | POA: Insufficient documentation

## 2018-09-02 ENCOUNTER — Telehealth: Payer: Medicare Other | Admitting: Physician Assistant

## 2018-09-03 ENCOUNTER — Telehealth (INDEPENDENT_AMBULATORY_CARE_PROVIDER_SITE_OTHER): Payer: Medicare Other | Admitting: Physician Assistant

## 2018-09-03 ENCOUNTER — Other Ambulatory Visit: Payer: Self-pay

## 2018-09-03 VITALS — BP 139/83 | HR 69 | Ht 70.0 in | Wt 185.0 lb

## 2018-09-03 DIAGNOSIS — E782 Mixed hyperlipidemia: Secondary | ICD-10-CM

## 2018-09-03 DIAGNOSIS — I10 Essential (primary) hypertension: Secondary | ICD-10-CM

## 2018-09-03 DIAGNOSIS — Z952 Presence of prosthetic heart valve: Secondary | ICD-10-CM

## 2018-09-03 NOTE — Patient Instructions (Addendum)
Medication Instructions:  Your physician recommends that you continue on your current medications as directed. Please refer to the Current Medication list given to you today. Stop Clopidogrel on December 16,2020  --or close to that day when you run out of pills If you need a refill on your cardiac medications before your next appointment, please call your pharmacy.   Lab work: none If you have labs (blood work) drawn today and your tests are completely normal, you will receive your results only by: [name] MyChart Message (if you have MyChart) OR . A paper copy in the mail If you have any lab test that is abnormal or we need to change your treatment, we will call you to review the results.  Testing/Procedures: Your physician has requested that you have an echocardiogram. Echocardiography is a painless test that uses sound waves to create images of your heart. It provides your doctor with information about the size and shape of your heart and how well your heart's chambers and valves are working. This procedure takes approximately one hour. There are no restrictions for this procedure. To be done in June 2021  Non-Cardiac CT scanning, (CAT scanning), is a noninvasive, special x-ray that produces cross-sectional images of the body using x-rays and a computer. CT scans help physicians diagnose and treat medical conditions. For some CT exams, a contrast material is used to enhance visibility in the area of the body being studied. CT scans provide greater clarity and reveal more details than regular x-ray exams.  To be done in June 2021  Follow up Your physician recommends that you schedule a follow-up appointment RT:MYTR 2021 with K. Grandville Silos, [place]      .

## 2018-09-03 NOTE — Progress Notes (Signed)
HEART AND VASCULAR CENTER   MULTIDISCIPLINARY HEART VALVE TEAM     Virtual Visit via Video Note   This visit type was conducted due to national recommendations for restrictions regarding the COVID-19 Pandemic (e.g. social distancing) in an effort to limit this patient's exposure and mitigate transmission in our community.  Due to his co-morbid illnesses, this patient is at least at moderate risk for complications without adequate follow up.  This format is felt to be most appropriate for this patient at this time.  All issues noted in this document were discussed and addressed.  A limited physical exam was performed with this format.  Please refer to the patient's chart for his consent to telehealth for [facility].   Evaluation Performed:  Follow-up visit  Date:  09/03/2018   ID:  Sean Abbott, DOB 01/12/1946, MRN [id number]  Patient Location: Home Provider Location: Office  PCP:  Default, Provider, MD  Cardiologist:  Quay Burow, MD / Dr. Burt Knack & Dr. Cyndia Bent (TAVR) --> lives in [place]   Chief Complaint: 1 month s/p TAVR   History of Present Illness:    Sean Abbott is a 73 y.o. male with a history of HTN, HLD, bicuspid Aov with severe AS s/p TAVR (08/04/18) who presents for follow up.   The patient does not have symptoms concerning for COVID-19 infection (fever, chills, cough, or new shortness of breath).   He was first diagnosed with a heart murmur in 2007 and has been followed by a cardiologist in [place] where he currently lives. He was noted to have progressive aortic stenosis on his most recent echocardiogram with a mean gradient of 49 mm across aortic valve. He is an active gentleman who plays golf 3 days/week and goes to the gym on the other days of the week. He has had progressive exertional fatigue and shortness of breath over the past year and only can walk about 9 holes playing golf before he has to stop. He called his previous cardiologist, Dr. Gwenlyn Found, who  recommended TAVR and the patient decided to travel to [facility] for TAVR work up. He underwent [facility] 07/20/18 which showed normal cors and confirmed severe AS.   He was evaluated by the multidisciplinary valve team and underwent successful TAVR with a 29 mm Edwards Sapien 3 THV via the TF approach on 08/04/18. His post op course was uncomplicated and he was discharged on aspirin and plavix.   Today he presents for follow up. He is back to playing golf. He is back to working out routine 1 hour 3 x a week. No CP or SOB. No LE edema, orthopnea or PND. No dizziness or syncope. No blood in stool or urine. No palpitations.     Past Medical History:  Diagnosis Date  . Aortic ectasia, thoracic ([facility])   . Hyperlipidemia   . Hypertension   . S/P TAVR (transcatheter aortic valve replacement)   . Severe aortic stenosis   . Skin cancer   . Thoracic aortic aneurysm [facility])    Past Surgical History:  Procedure Laterality Date  . CARDIAC CATHETERIZATION  12/05/2005   Normal coronary arteries, rule out thoracic aortic aneurysm  . MOUTH SURGERY  2003  . RIGHT HEART CATH AND CORONARY ANGIOGRAPHY N/A 07/20/2018   Procedure: RIGHT HEART CATH AND CORONARY ANGIOGRAPHY;  Surgeon: Lorretta Harp, MD;  Location: [facility];  Service: Cardiovascular;  Laterality: N/A;  . TEE WITHOUT CARDIOVERSION N/A 08/04/2018   Procedure: TRANSESOPHAGEAL ECHOCARDIOGRAM (TEE);  Surgeon: Burt Knack,  Legrand Como, MD;  Location: [facility];  Service: Open Heart Surgery;  Laterality: N/A;  . TENDON REPAIR Left 2005   [place]  . TRANSCATHETER AORTIC VALVE REPLACEMENT, TRANSFEMORAL  08/04/2018  . TRANSCATHETER AORTIC VALVE REPLACEMENT, TRANSFEMORAL N/A 08/04/2018   Procedure: TRANSCATHETER AORTIC VALVE REPLACEMENT, TRANSFEMORAL;  Surgeon: Sherren Mocha, MD;  Location: [facility];  Service: Open Heart Surgery;  Laterality: N/A;     Current Meds  Medication Sig  . amoxicillin (AMOXIL) 500 MG tablet Take 4 tablets (2,000 mg  total) by mouth as directed. 1 hour before dental work, including cleanings  . aspirin EC 81 MG tablet Take 81 mg by mouth every evening.   . clopidogrel (PLAVIX) 75 MG tablet Take 1 tablet (75 mg total) by mouth daily with breakfast.  . CRESTOR 10 MG tablet TAKE 1 TABLET BY MOUTH EVERY OTHER DAY (Patient taking differently: Take 10 mg by mouth every other day. )  . Glucosamine-Chondroitin (COSAMIN DS PO) Take 1 tablet by mouth daily.  . Multiple Vitamin (MULTIVITAMIN WITH MINERALS) TABS tablet Take 1 tablet by mouth daily.     Allergies:   Lipitor [atorvastatin] and Demerol [meperidine]   Social History   Tobacco Use  . Smoking status: Never Smoker  . Smokeless tobacco: Never Used  Substance Use Topics  . Alcohol use: Yes    Alcohol/week: 1.0 standard drinks    Types: 1 Glasses of wine per week    Comment: every night  . Drug use: Never     Family Hx: The patient's family history includes Diabetes in his father; Heart disease in his father; Hypertension in his father.  ROS:   Please see the history of present illness.    All other systems reviewed and are negative.   Prior CV studies:   The following studies were reviewed today:  TAVR OPERATIVE NOTE   Date of Procedure:08/04/2018  Preoperative Diagnosis:Severe Aortic Stenosis   Postoperative Diagnosis:Same   Procedure:   Transcatheter Aortic Valve Replacement - PercutaneousRightTransfemoral Approach Edwards Sapien 3 THV (size 81mm, model # 9600TFX, serial # [id number])  Co-Surgeons:Bryan Alveria Apley, MD and Sherren Mocha, MD   Anesthesiologist:Robert Ola Spurr, MD  Echocardiographer:Peter Johnsie Cancel, MD  Pre-operative Echo Findings: ? Severebicuspidaorticvalvestenosis ? normalleft ventricular systolic function  Post-operative Echo Findings: ? noparavalvular leak ? normalleft  ventricular systolic function   _____________    Echo 08/05/18:  IMPRESSIONS  1. A 3mm Edwards Sapien bioprosthetic aortic valve (TAVR) valve is present in the aortic position. Procedure Date: 08/04/2018 Echo findings shows no evidence of rocking, dehiscence, regurgitation or perivalvular leak of the aortic prosthesis.  2. Mean systolic gradient 74.2 mmHg. Using an LVOT diameter of 2.5 cm, the prosthetic aortic valve area is 2.7 cm2.  3. The left ventricle has normal systolic function with an ejection fraction of 60-65%. The cavity size was normal. There is moderately increased left ventricular wall thickness. Left ventricular diastolic Doppler parameters are consistent with  pseudonormalization. Indeterminate filling pressures.  4. The right ventricle has normal systolic function. The cavity was normal. There is no increase in right ventricular wall thickness.   _____________   Echo 08/28/18 IMPRESSIONS  1. The left ventricle has hyperdynamic systolic function, with an ejection fraction of >65%. The cavity size was normal. There is moderately increased left ventricular wall thickness. Left ventricular diastolic Doppler parameters are consistent with  impaired relaxation.  2. The right ventricle has normal systolic function. The cavity was normal.  3. The tricuspid valve is grossly normal.  4. A 47mm [place] TAVR valve is present in the aortic position. Procedure Date: 08/04/2018 Normal aortic valve prosthesis.  5. There is mild dilatation of the ascending aorta measuring 43 mm.  6. Vigorous LV systolic function; moderate LVH; mild diastolic dysfunction; mildly dilated ascending aorta; s/p TAVR with mean gradient of 10 mmHg and no AI.  7. The mitral valve is grossly normal.  8. No stenosis of the aortic valve.   Labs/Other Tests and Data Reviewed:    EKG:  No ECG reviewed.  Recent Labs: 07/31/2018: ALT 7; B Natriuretic Peptide 60.0 08/05/2018: BUN 21; Creatinine, Ser  1.17; Hemoglobin 12.9; Magnesium 1.7; Platelets 126; Potassium 3.7; Sodium 140   Recent Lipid Panel No results found for: CHOL, TRIG, HDL, CHOLHDL, LDLCALC, LDLDIRECT  Wt Readings from Last 3 Encounters:  09/03/18 185 lb (83.9 kg)  08/14/18 190 lb (86.2 kg)  08/05/18 192 lb 3.9 oz (87.2 kg)     Objective:    Vital Signs:  BP 139/83   Pulse 69   Ht 5\' 10"  (1.778 m)   Wt 185 lb (83.9 kg)   SpO2 98%   BMI 26.54 kg/m    Well nourished, well developed male in no acute distress.   ASSESSMENT & PLAN:    Severe AS s/p TAVR: doing well. He has NYHA class II symptoms; mostly of fatigue. Echo 08/28/18 showed EF 65% with normally functioning TAVR with mean gradient of 10 mm Hg and no PVL. Continue on aspirin and plavix; he can stop plavix after 6 months ( 01/2019). I will see him back in 1 year for echo, follow up CTA and office visit. He will let us know when he will be back in town so we can have it arranged.   HLD: continue statin   HTN: he has not been on any antihypertensives historically. He has noticed that since his surgery his BP has been elevated with an average of 138/78. He is just starting back is exercise regimen and would like to try lifestyle modifications such as sodium restriction, diet and exercise and continue to follow. If it remains elevated, I am happy to trial a medication or he can discuss this with his local cardiologist or PCP.   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 Follow up CTA/MRA recommended at 1 year. Will arrange CTA when he comes in for 1 year echo and follow up.   COVID-19 Education: The signs and symptoms of COVID-19 were discussed with the patient and how to seek care for testing (follow up with PCP or arrange E-visit).  The importance of social distancing was discussed today.  Time:   Today, I have spent 20 minutes with the patient with telehealth technology discussing the above problems.     Medication Adjustments/Labs and Tests  Ordered: Current medicines are reviewed at length with the patient today.  Concerns regarding medicines are outlined above.   Tests Ordered: No orders of the defined types were placed in this encounter.   Medication Changes: No orders of the defined types were placed in this encounter.   Disposition:  Follow up in 1 year(s)  Signed, Angelena Form, PA-C  09/03/2018 4:56 PM    [facility]

## 2018-09-09 ENCOUNTER — Encounter: Payer: Self-pay | Admitting: Surgery

## 2019-01-18 ENCOUNTER — Other Ambulatory Visit: Payer: Self-pay | Admitting: Physician Assistant

## 2019-01-20 NOTE — Telephone Encounter (Signed)
This is Dr. Berry's pt 

## 2019-06-24 ENCOUNTER — Other Ambulatory Visit: Payer: Self-pay

## 2019-06-24 DIAGNOSIS — Z952 Presence of prosthetic heart valve: Secondary | ICD-10-CM

## 2019-06-24 DIAGNOSIS — I350 Nonrheumatic aortic (valve) stenosis: Secondary | ICD-10-CM

## 2019-08-26 ENCOUNTER — Ambulatory Visit (HOSPITAL_COMMUNITY): Payer: Medicare Other | Attending: Cardiology

## 2019-08-26 ENCOUNTER — Other Ambulatory Visit: Payer: Self-pay

## 2019-08-26 ENCOUNTER — Encounter: Payer: Self-pay | Admitting: Physician Assistant

## 2019-08-26 ENCOUNTER — Ambulatory Visit (INDEPENDENT_AMBULATORY_CARE_PROVIDER_SITE_OTHER): Payer: Medicare Other | Admitting: Physician Assistant

## 2019-08-26 VITALS — BP 134/78 | HR 59 | Ht 70.0 in | Wt 185.0 lb

## 2019-08-26 DIAGNOSIS — I350 Nonrheumatic aortic (valve) stenosis: Secondary | ICD-10-CM | POA: Diagnosis present

## 2019-08-26 DIAGNOSIS — I10 Essential (primary) hypertension: Secondary | ICD-10-CM | POA: Diagnosis not present

## 2019-08-26 DIAGNOSIS — I712 Thoracic aortic aneurysm, without rupture, unspecified: Secondary | ICD-10-CM

## 2019-08-26 DIAGNOSIS — E782 Mixed hyperlipidemia: Secondary | ICD-10-CM

## 2019-08-26 DIAGNOSIS — Z952 Presence of prosthetic heart valve: Secondary | ICD-10-CM | POA: Insufficient documentation

## 2019-08-26 NOTE — Patient Instructions (Signed)
Medication Instructions:  Your physician recommends that you continue on your current medications as directed. Please refer to the Current Medication list given to you today.  *If you need a refill on your cardiac medications before your next appointment, please call your pharmacy*   Lab Work: None If you have labs (blood work) drawn today and your tests are completely normal, you will receive your results only by: [name] MyChart Message (if you have MyChart) OR . A paper copy in the mail If you have any lab test that is abnormal or we need to change your treatment, we will call you to review the results.   Testing/Procedures: Your physician recommends that you have a CT Angio Chest performed once you are back in [place].   Follow-Up: At [facility], you and your health needs are our priority.  As part of our continuing mission to provide you with exceptional heart care, we have created designated Provider Care Teams.  These Care Teams include your primary Cardiologist (physician) and Advanced Practice Providers (APPs -  Physician Assistants and Nurse Practitioners) who all work together to provide you with the care you need, when you need it.  We recommend signing up for the patient portal called "MyChart".  Sign up information is provided on this After Visit Summary.  MyChart is used to connect with patients for Virtual Visits (Telemedicine).  Patients are able to view lab/test results, encounter notes, upcoming appointments, etc.  Non-urgent messages can be sent to your provider as well.   To learn more about what you can do with MyChart, go to [email or web address].    Your next appointment:   12 month(s)  The format for your next appointment:   In Person  Provider:   You may see Quay Burow, MD or one of the following Advanced Practice Providers on your designated Care Team:    Kerin Ransom, PA-C  [place], [place]  Coletta Memos, [place]    Other Instructions

## 2019-08-26 NOTE — Progress Notes (Signed)
[name]                                       Cardiology Office Note    Date:  08/26/2019   ID:  Sean Abbott, DOB [birth year], MRN [id number]  PCP:  Default, Provider, MD  Cardiologist:  Quay Burow, MD / Dr. Burt Knack & Dr. Cyndia Bent (TAVR) --> lives in [place] and followed by Dr. Mitchel Honour in [place]  [place]: 1 year s/p TAVR  History of Present Illness:  Sean Abbott is a 74 y.o. male with a history of HTN, HLD, bicuspid Aov with severe AS s/p TAVR (08/04/18) who presents for follow up.   The patient does not have symptoms concerning for COVID-19 infection (fever, chills, cough, or new shortness of breath).   He was first diagnosed with a heart murmur in 2007 and has been followed by a cardiologist in [place] where he currently lives. He was noted to have progressive aortic stenosis on his most recent echocardiogram with a mean gradient of 49 mm across aortic valve. He is an active gentleman who plays golf 3 days/week and goes to the gym on the other days of the week. He has had progressive exertional fatigue and shortness of breath over the past year and only can walk about 9 holes playing golfbefore he has tostop. He called his previous cardiologist, Dr. Gwenlyn Found, who recommended TAVR and the patient decided to travel to [facility] for TAVR work up. He underwent [facility] 07/20/18 which showed normal cors and confirmed severe AS.  He was evaluated by the multidisciplinary valve team and underwent successful TAVR with a33mm Edwards Sapien 3 THV via the TF approach on 08/04/18. His post op course was uncomplicated and he was discharged on aspirin and plavix. 1 month echo 08/28/18 showed EF 65% with normally functioning TAVR with mean gradient of 10 mm Hg and no PVL.   Today he presents to clinic for follow up. No CP or SOB. No LE edema, orthopnea or PND. No dizziness or syncope. No blood in stool or urine. No palpitations.  He plays golf 3  times a week and works out 2 times a week and has 1 rest day.  He still mostly lives in [place] and follows with Dr. Mitchel Honour.     Past Medical History:  Diagnosis Date  . Aortic ectasia, thoracic ([place])   . Hyperlipidemia   . Hypertension   . S/P TAVR (transcatheter aortic valve replacement)   . Severe aortic stenosis   . Skin cancer   . Thoracic aortic aneurysm [facility])     Past Surgical History:  Procedure Laterality Date  . CARDIAC CATHETERIZATION  12/05/2005   Normal coronary arteries, rule out thoracic aortic aneurysm  . MOUTH SURGERY  2003  . RIGHT HEART CATH AND CORONARY ANGIOGRAPHY N/A 07/20/2018   Procedure: RIGHT HEART CATH AND CORONARY ANGIOGRAPHY;  Surgeon: Lorretta Harp, MD;  Location: [place] CV LAB;  Service: Cardiovascular;  Laterality: N/A;  . TEE WITHOUT CARDIOVERSION N/A 08/04/2018   Procedure: TRANSESOPHAGEAL ECHOCARDIOGRAM (TEE);  Surgeon: Sherren Mocha, MD;  Location: [place] CV LAB;  Service: Open Heart Surgery;  Laterality: N/A;  . TENDON REPAIR Left 2005   [place]  . TRANSCATHETER AORTIC VALVE REPLACEMENT, TRANSFEMORAL  08/04/2018  . TRANSCATHETER AORTIC VALVE REPLACEMENT, TRANSFEMORAL N/A 08/04/2018   Procedure: TRANSCATHETER AORTIC  VALVE REPLACEMENT, TRANSFEMORAL;  Surgeon: Sherren Mocha, MD;  Location: [place] CV LAB;  Service: Open Heart Surgery;  Laterality: N/A;    Current Medications: Outpatient Medications Prior to Visit  Medication Sig Dispense Refill  . amoxicillin (AMOXIL) 500 MG tablet Take 4 tablets (2,000 mg total) by mouth as directed. 1 hour before dental work, including cleanings 12 tablet 12  . aspirin EC 81 MG tablet Take 81 mg by mouth every evening.     [name] CRESTOR 10 MG tablet TAKE 1 TABLET BY MOUTH EVERY OTHER DAY 30 tablet 3  . Glucosamine-Chondroitin (COSAMIN DS PO) Take 1 tablet by mouth daily.    [name] lisinopril (ZESTRIL) 20 MG tablet Take 20 mg by mouth daily.    . Multiple Vitamin (MULTIVITAMIN WITH MINERALS)  TABS tablet Take 1 tablet by mouth daily.    . clopidogrel (PLAVIX) 75 MG tablet TAKE 1 TABLET (75 MG TOTAL) BY MOUTH DAILY WITH BREAKFAST. 30 tablet 0   No facility-administered medications prior to visit.     Allergies:   Lipitor [atorvastatin] and Demerol [meperidine]   Social History   Socioeconomic History  . Marital status: Married    Spouse name: Not on file  . Number of children: Not on file  . Years of education: Not on file  . Highest education level: Not on file  Occupational History  . Not on file  Tobacco Use  . Smoking status: Never Smoker  . Smokeless tobacco: Never Used  Vaping Use  . Vaping Use: Never used  Substance and Sexual Activity  . Alcohol use: Yes    Alcohol/week: 1.0 standard drink    Types: 1 Glasses of wine per week    Comment: every night  . Drug use: Never  . Sexual activity: Not on file  Other Topics Concern  . Not on file  Social History Narrative  . Not on file   Social Determinants of Health   Financial Resource Strain:   . Difficulty of Paying Living Expenses:   Food Insecurity:   . Worried About Charity fundraiser in the Last Year:   . Arboriculturist in the Last Year:   Transportation Needs:   . Film/video editor (Medical):   [name] Lack of Transportation (Non-Medical):   Physical Activity:   . Days of Exercise per Week:   . Minutes of Exercise per Session:   Stress:   . Feeling of Stress :   Social Connections:   . Frequency of Communication with Friends and Family:   . Frequency of Social Gatherings with Friends and Family:   . Attends Religious Services:   . Active Member of Clubs or Organizations:   . Attends Archivist Meetings:   [name] Marital Status:      Family History:  The patient's family history includes Diabetes in his father; Heart disease in his father; Hypertension in his father.     ROS:   Please see the history of present illness.    ROS All other systems reviewed and are  negative.   PHYSICAL EXAM:   VS:  BP 134/78 (BP Location: Left Arm, Patient Position: Sitting, Cuff Size: Normal)   Pulse (!) 59   Ht 5\' 10"  (1.778 m)   Wt 185 lb (83.9 kg)   SpO2 98%   BMI 26.54 kg/m    GEN: Well nourished, well developed, in no acute distress, appears younger than stated age 51: normal Neck: no JVD or masses Cardiac: RRR;  no murmurs, rubs, or gallops,no edema  Respiratory:  clear to auscultation bilaterally, normal work of breathing GI: soft, nontender, nondistended, + BS MS: no deformity or atrophy Skin: warm and dry, no rash Neuro:  Alert and Oriented x 3, Strength and sensation are intact Psych: euthymic mood, full affect   Wt Readings from Last 3 Encounters:  08/26/19 185 lb (83.9 kg)  09/03/18 185 lb (83.9 kg)  08/14/18 190 lb (86.2 kg)      Studies/Labs Reviewed:   EKG:  EKG is ordered today.  The ekg ordered today demonstrates sinus brady with HR 59 bpm  Recent Labs: No results found for requested labs within last 8760 hours.   Lipid Panel No results found for: CHOL, TRIG, HDL, CHOLHDL, VLDL, LDLCALC, LDLDIRECT  Additional studies/ records that were reviewed today include:  TAVR OPERATIVE NOTE   Date of Procedure:08/04/2018  Preoperative Diagnosis:Severe Aortic Stenosis   Postoperative Diagnosis:Same   Procedure:   Transcatheter Aortic Valve Replacement - PercutaneousRightTransfemoral Approach Edwards Sapien 3 THV (size 70mm, model # 9600TFX, serial # [id number])  Co-Surgeons:Bryan Alveria Apley, MD and Sherren Mocha, MD   Anesthesiologist:Robert Ola Spurr, MD  Echocardiographer:Peter Johnsie Cancel, MD  Pre-operative Echo Findings: ? Severebicuspidaorticvalvestenosis ? normalleft ventricular systolic function  Post-operative Echo Findings: ? noparavalvular leak ? normalleft ventricular systolic  function   _____________   Echo 08/05/18:  IMPRESSIONS 1. A 13mm Edwards Sapien bioprosthetic aortic valve (TAVR) valve is present in the aortic position. Procedure Date: 08/04/2018 Echo findings shows no evidence of rocking, dehiscence, regurgitation or perivalvular leak of the aortic prosthesis. 2. Mean systolic gradient 44.0 mmHg. Using an LVOT diameter of 2.5 cm, the prosthetic aortic valve area is 2.7 cm2. 3. The left ventricle has normal systolic function with an ejection fraction of 60-65%. The cavity size was normal. There is moderately increased left ventricular wall thickness. Left ventricular diastolic Doppler parameters are consistent with  pseudonormalization. Indeterminate filling pressures. 4. The right ventricle has normal systolic function. The cavity was normal. There is no increase in right ventricular wall thickness.   _____________   Echo 08/28/18 IMPRESSIONS 1. The left ventricle has hyperdynamic systolic function, with an ejection fraction of >65%. The cavity size was normal. There is moderately increased left ventricular wall thickness. Left ventricular diastolic Doppler parameters are consistent with  impaired relaxation. 2. The right ventricle has normal systolic function. The cavity was normal. 3. The tricuspid valve is grossly normal. 4. A 45mm Edwards [employer] TAVR valve is present in the aortic position. Procedure Date: 08/04/2018 Normal aortic valve prosthesis. 5. There is mild dilatation of the ascending aorta measuring 43 mm. 6. Vigorous LV systolic function; moderate LVH; mild diastolic dysfunction; mildly dilated ascending aorta; s/p TAVR with mean gradient of 10 mmHg and no AI. 7. The mitral valve is grossly normal. 8. No stenosis of the aortic valve.   __________________  Echo 08/26/19 1. Normal LV systolic function; grade 1 diastolic dysfunction; mild LVH;  s/p TAVR with mean gradient 9 mmHg, AVA 1.2 cm2 and trace AI.  2.  Left ventricular ejection fraction, by estimation, is 70 to 75%. The  left ventricle has hyperdynamic function. The left ventricle has no  regional wall motion abnormalities. There is mild left ventricular  hypertrophy. Left ventricular diastolic  parameters are consistent with Grade I diastolic dysfunction (impaired  relaxation).  3. Right ventricular systolic function is normal. The right ventricular  size is normal. Tricuspid regurgitation signal is inadequate for assessing  PA pressure.  4. The mitral  valve is normal in structure. Trivial mitral valve  regurgitation. No evidence of mitral stenosis.  5. The aortic valve has been repaired/replaced. Aortic valve  regurgitation is trivial. No aortic stenosis is present. There is a 29 mm  Edwards Sapien prosthetic (TAVR) valve present in the aortic position.  Procedure Date: 08/04/2018.  6. Aortic dilatation noted. There is mild dilatation of the ascending  aorta measuring 38 mm.  7. The inferior vena cava is normal in size with greater than 50%  respiratory variability, suggesting right atrial pressure of 3 mmHg.   ASSESSMENT & PLAN:   Severe AS s/p TAVR:  Doing excellent with NYHA class I symptoms.  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  Echo today shows EF 70%, with a normally functioning TAVR with a mean gradient of 9 mmHg and trivial PVL.  He is on aspirin alone.  He understands the ongoing need for SBE prophylaxis. Continue regular follow up with Dr. Mitchel Honour and Dr. Gwenlyn Found.   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 He will need follow-up CTA or MRA annually.  He leaves for [place] on Saturday and our office cannot accommodate this test before then.  He prefers to have this followed long-term in [place] by Dr. Mitchel Honour.  I will fax my note to his office.    Medication Adjustments/Labs and  Tests Ordered: Current medicines are reviewed at length with the patient today.  Concerns regarding medicines are outlined above.  Medication changes, Labs and Tests ordered today are listed in the Patient Instructions below. Patient Instructions  Medication Instructions:  Your physician recommends that you continue on your current medications as directed. Please refer to the Current Medication list given to you today.  *If you need a refill on your cardiac medications before your next appointment, please call your pharmacy*   Lab Work: None If you have labs (blood work) drawn today and your tests are completely normal, you will receive your results only by: [name] MyChart Message (if you have MyChart) OR . A paper copy in the mail If you have any lab test that is abnormal or we need to change your treatment, we will call you to review the results.   Testing/Procedures: Your physician recommends that you have a CT Angio Chest performed once you are back in [place].   Follow-Up: At [facility], you and your health needs are our priority.  As part of our continuing mission to provide you with exceptional heart care, we have created designated Provider Care Teams.  These Care Teams include your primary Cardiologist (physician) and Advanced Practice Providers (APPs -  Physician Assistants and Nurse Practitioners) who all work together to provide you with the care you need, when you need it.  We recommend signing up for the patient portal called "MyChart".  Sign up information is provided on this After Visit Summary.  MyChart is used to connect with patients for Virtual Visits (Telemedicine).  Patients are able to view lab/test results, encounter notes, upcoming appointments, etc.  Non-urgent messages can be sent to your provider as well.   To learn more about what you can do with MyChart, go to [email or web address].    Your next appointment:   12 month(s)  The format for your next  appointment:   In Person  Provider:   You may see Quay Burow, MD or one of  the following Advanced Practice Providers on your designated Care Team:    Kerin Ransom, PA-C  [place], [place]  Coletta Memos, FNP    Other Instructions      Signed, Angelena Form, Hershal Coria  08/26/2019 3:42 PM    [facility] [address], [place], [place]  [address] Phone: [phone]; Fax: [phone]

## 2019-09-01 NOTE — Addendum Note (Signed)
Addended by: Mady Haagensen on: 09/01/2019 01:59 PM   Modules accepted: Orders

## 2020-06-03 IMAGING — CT CT HEAR MORPH WITH CTA COR WITH SCORE WITH CA WITH CONTRAST AND
2 of 9 series · 9 of 20 positions shown, 11 images · non-contrast
Comparison: Chest CT 11/24/2012.

Addendum:
CLINICAL DATA: Aortic Stenosis

EXAM:
Cardiac TAVR CT
TECHNIQUE: The patient was scanned on a Siemens Force [REDACTED]ice scanner. A 120
kV retrospective scan was triggered in the ascending thoracic aorta
at 140 HU's. Gantry rotation speed was 250 msecs and collimation was
.6 mm. No beta blockade or nitro were given. The 3D data set was
reconstructed in 5% intervals of the R-R cycle. Systolic and
diastolic phases were analyzed on a dedicated work station using
MPR, MIP and VRT modes. The patient received 80 cc of contrast.

[Series 8: 0-90% · axial · 0.37mm/px · z∈[+1061,+1193]mm · 4 of 3680 slices shown]
[im 736/3680  vessel]
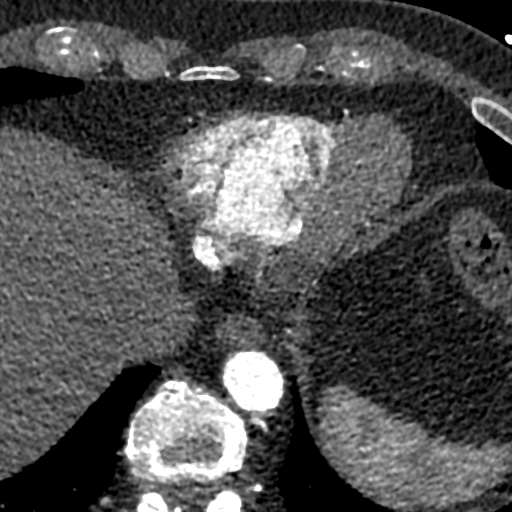
[im 1472/3680  vessel]
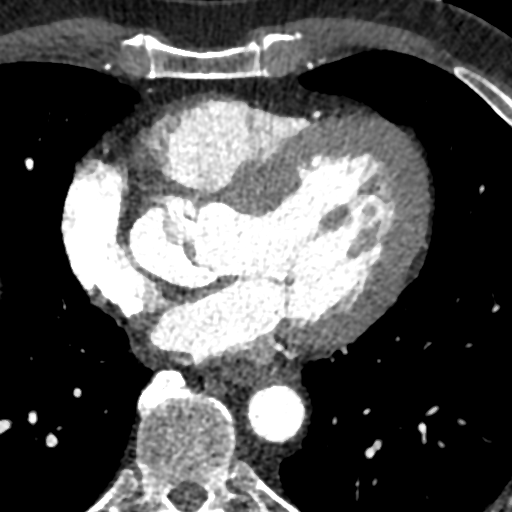
[im 2208/3680  vessel]
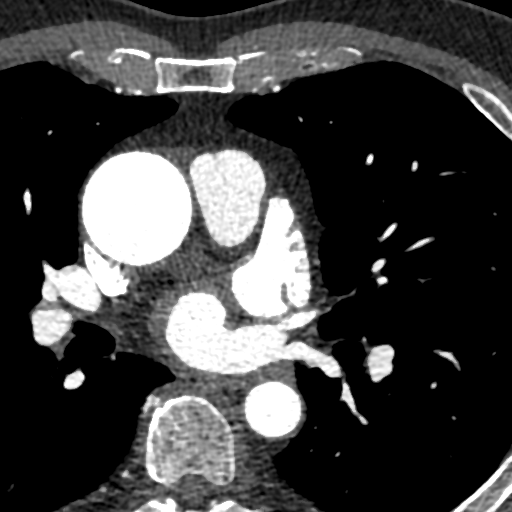
[im 2944/3680  vessel]
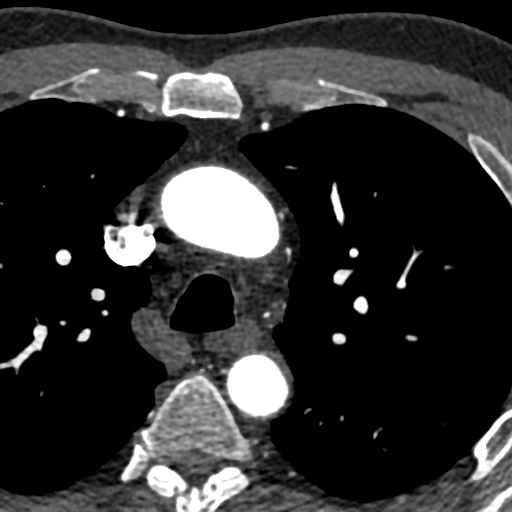

[Series 9: 5-95% · axial · 0.37mm/px · z∈[+1053,+1200]mm · 5 of 3680 slices shown, 7 images]
[im 614/3680  vessel]
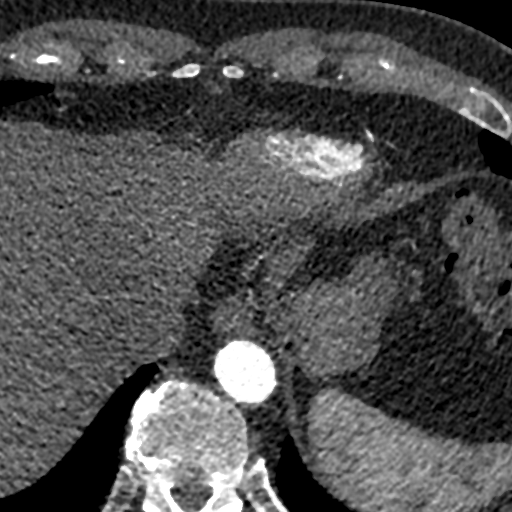
[im 614/3680  lung]
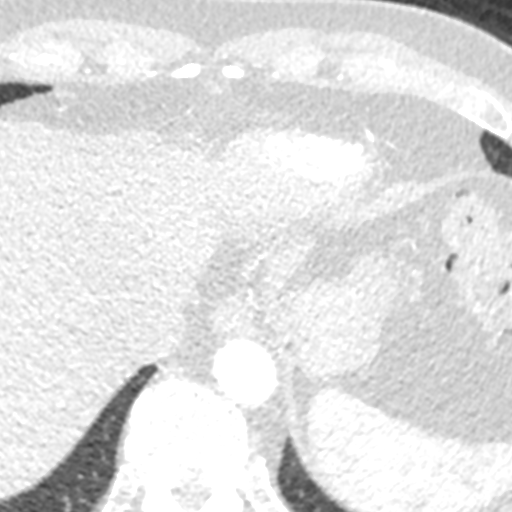
[im 1227/3680  vessel]
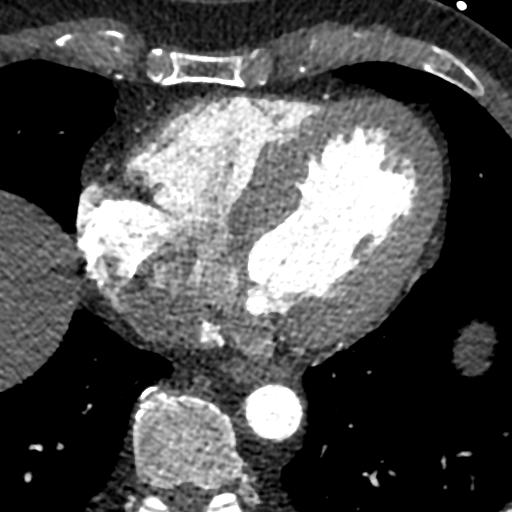
[im 1840/3680  vessel]
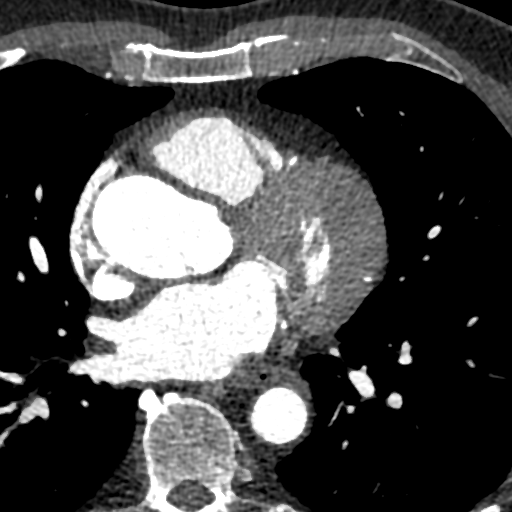
[im 2453/3680  vessel]
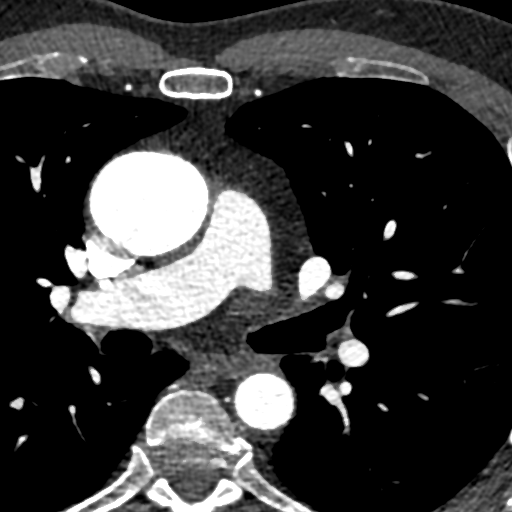
[im 3066/3680  vessel]
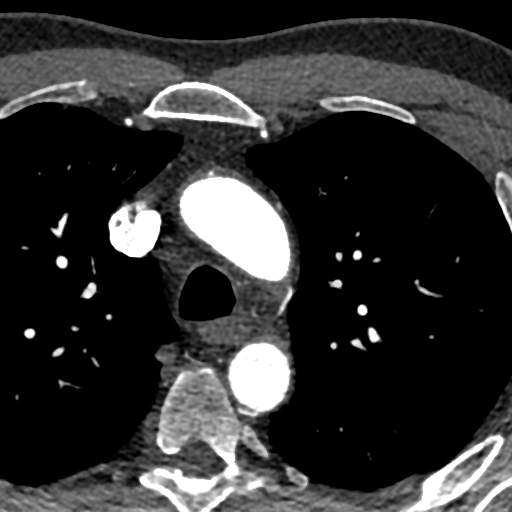
[im 3066/3680  lung]
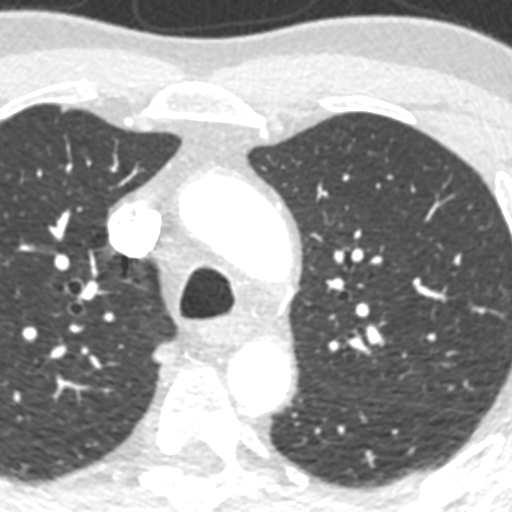

[9 of 20 positions shown; findings below may reference images not displayed]

FINDINGS: Aortic Valve: Bicuspid and calcified with raphe between the right
and left cusps

Aorta: Mild atherosclerosis with ascending root aneurysm and bovine
arch

[place]unction: 32 mm

Ascending Thoracic Aorta: 45 mm

Aortic Arch: 23 mm

Descending Thoracic Aorta: 22 mm

Sinus of Valsalva Measurements:

Commissural Sinus: 32.6 mm

Non Commissural Sinus: 37.3 mm

Coronary Artery Height above Annulus:

Left Main: 17.5 mm above annulus

Right Coronary: 17 mm above annulus

Virtual Basal Annulus Measurements:

Maximum / Minimum Diameter: 30 mm x 25.9 mm

Perimeter: 90 mm

Area: 598 mm2

Coronary Arteries: Sufficient height above annulus for deployment

Optimum Fluoroscopic Angle for Delivery: LAO 16 Caudal 15 degrees
IMPRESSION: 1. Bicuspid AV with annular area of 598 mm2 suitable for a 29 mm
Sapien 3 valve

2.  Ascending aortic root aneurysm 4.5 cm

3.  Coronary arteries sufficient height above annulus for deployment

4.  No JUMPER thrombus

5. Optimum angiographic angle for deployment LAO 16 Caudal 15
degrees

Takeharu Raff

EXAM:
OVER-READ INTERPRETATION  CT CHEST

The following report is an over-read performed by radiologist Dr.
over-read does not include interpretation of cardiac or coronary
anatomy or pathology. The cardiac CTA interpretation by the
cardiologist is attached.
FINDINGS: Extracardiac findings will be described separately under dictation
for contemporaneously obtained CTA chest, abdomen and pelvis.
IMPRESSION: Please see separate dictation for contemporaneously obtained CTA
chest, abdomen and pelvis 07/17/2018 for full description of
relevant extracardiac findings.

*** End of Addendum ***
FINDINGS: Aortic Valve: Bicuspid and calcified with raphe between the right
and left cusps

Aorta: Mild atherosclerosis with ascending root aneurysm and bovine
arch

[place]unction: 32 mm

Ascending Thoracic Aorta: 45 mm

Aortic Arch: 23 mm

Descending Thoracic Aorta: 22 mm

Sinus of Valsalva Measurements:

Commissural Sinus: 32.6 mm

Non Commissural Sinus: 37.3 mm

Coronary Artery Height above Annulus:

Left Main: 17.5 mm above annulus

Right Coronary: 17 mm above annulus

Virtual Basal Annulus Measurements:

Maximum / Minimum Diameter: 30 mm x 25.9 mm

Perimeter: 90 mm

Area: 598 mm2

Coronary Arteries: Sufficient height above annulus for deployment

Optimum Fluoroscopic Angle for Delivery: LAO 16 Caudal 15 degrees
IMPRESSION: 1. Bicuspid AV with annular area of 598 mm2 suitable for a 29 mm
Sapien 3 valve

2.  Ascending aortic root aneurysm 4.5 cm

3.  Coronary arteries sufficient height above annulus for deployment

4.  No JUMPER thrombus

5. Optimum angiographic angle for deployment LAO 16 Caudal 15
degrees

Takeharu Raff

## 2020-06-12 ENCOUNTER — Other Ambulatory Visit: Payer: Self-pay | Admitting: *Deleted

## 2020-06-12 DIAGNOSIS — Z952 Presence of prosthetic heart valve: Secondary | ICD-10-CM

## 2020-06-12 MED ORDER — AMOXICILLIN 500 MG PO TABS: 2000.0000 mg | ORAL_TABLET | ORAL | 3 refills | Status: AC

## 2020-06-13 NOTE — Addendum Note (Signed)
Addended by: Harland German A on: 06/13/2020 03:51 PM   Modules accepted: Orders

## 2020-06-13 NOTE — Telephone Encounter (Signed)
Per Dr. Gwenlyn Found, he would like pt to have a repeat echo while in town for appointment. If pt has recent lab work we would like a copy either sent or brought with pt to office visit.

## 2020-06-17 IMAGING — CR CHEST - 2 VIEW
2 series · 2 of 2 positions shown · non-contrast
Comparison: None.

CLINICAL DATA: Pre-op respiratory exam for severe aortic stenosis.

EXAM:
CHEST - 2 VIEW

[w chest pa]
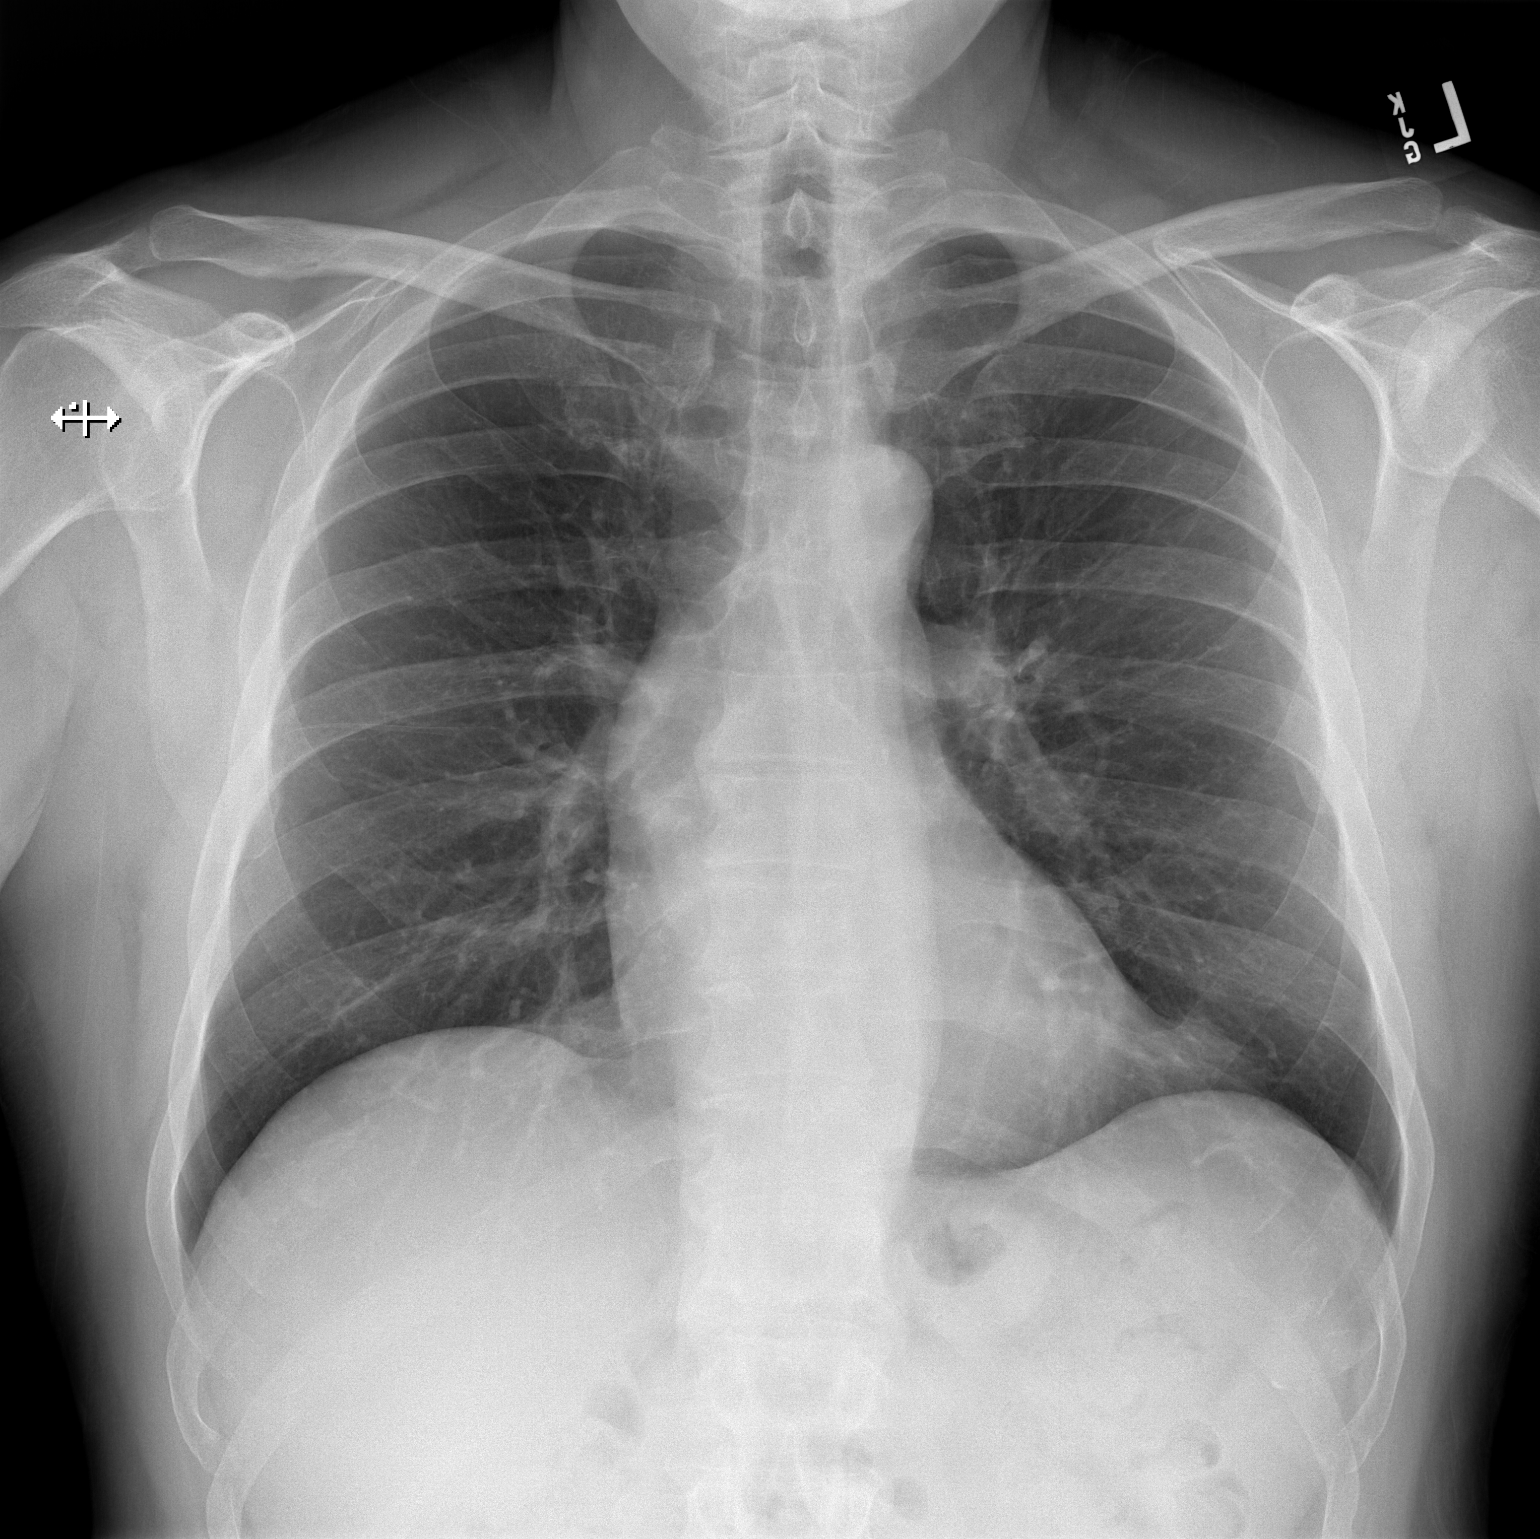

[w chest lat]
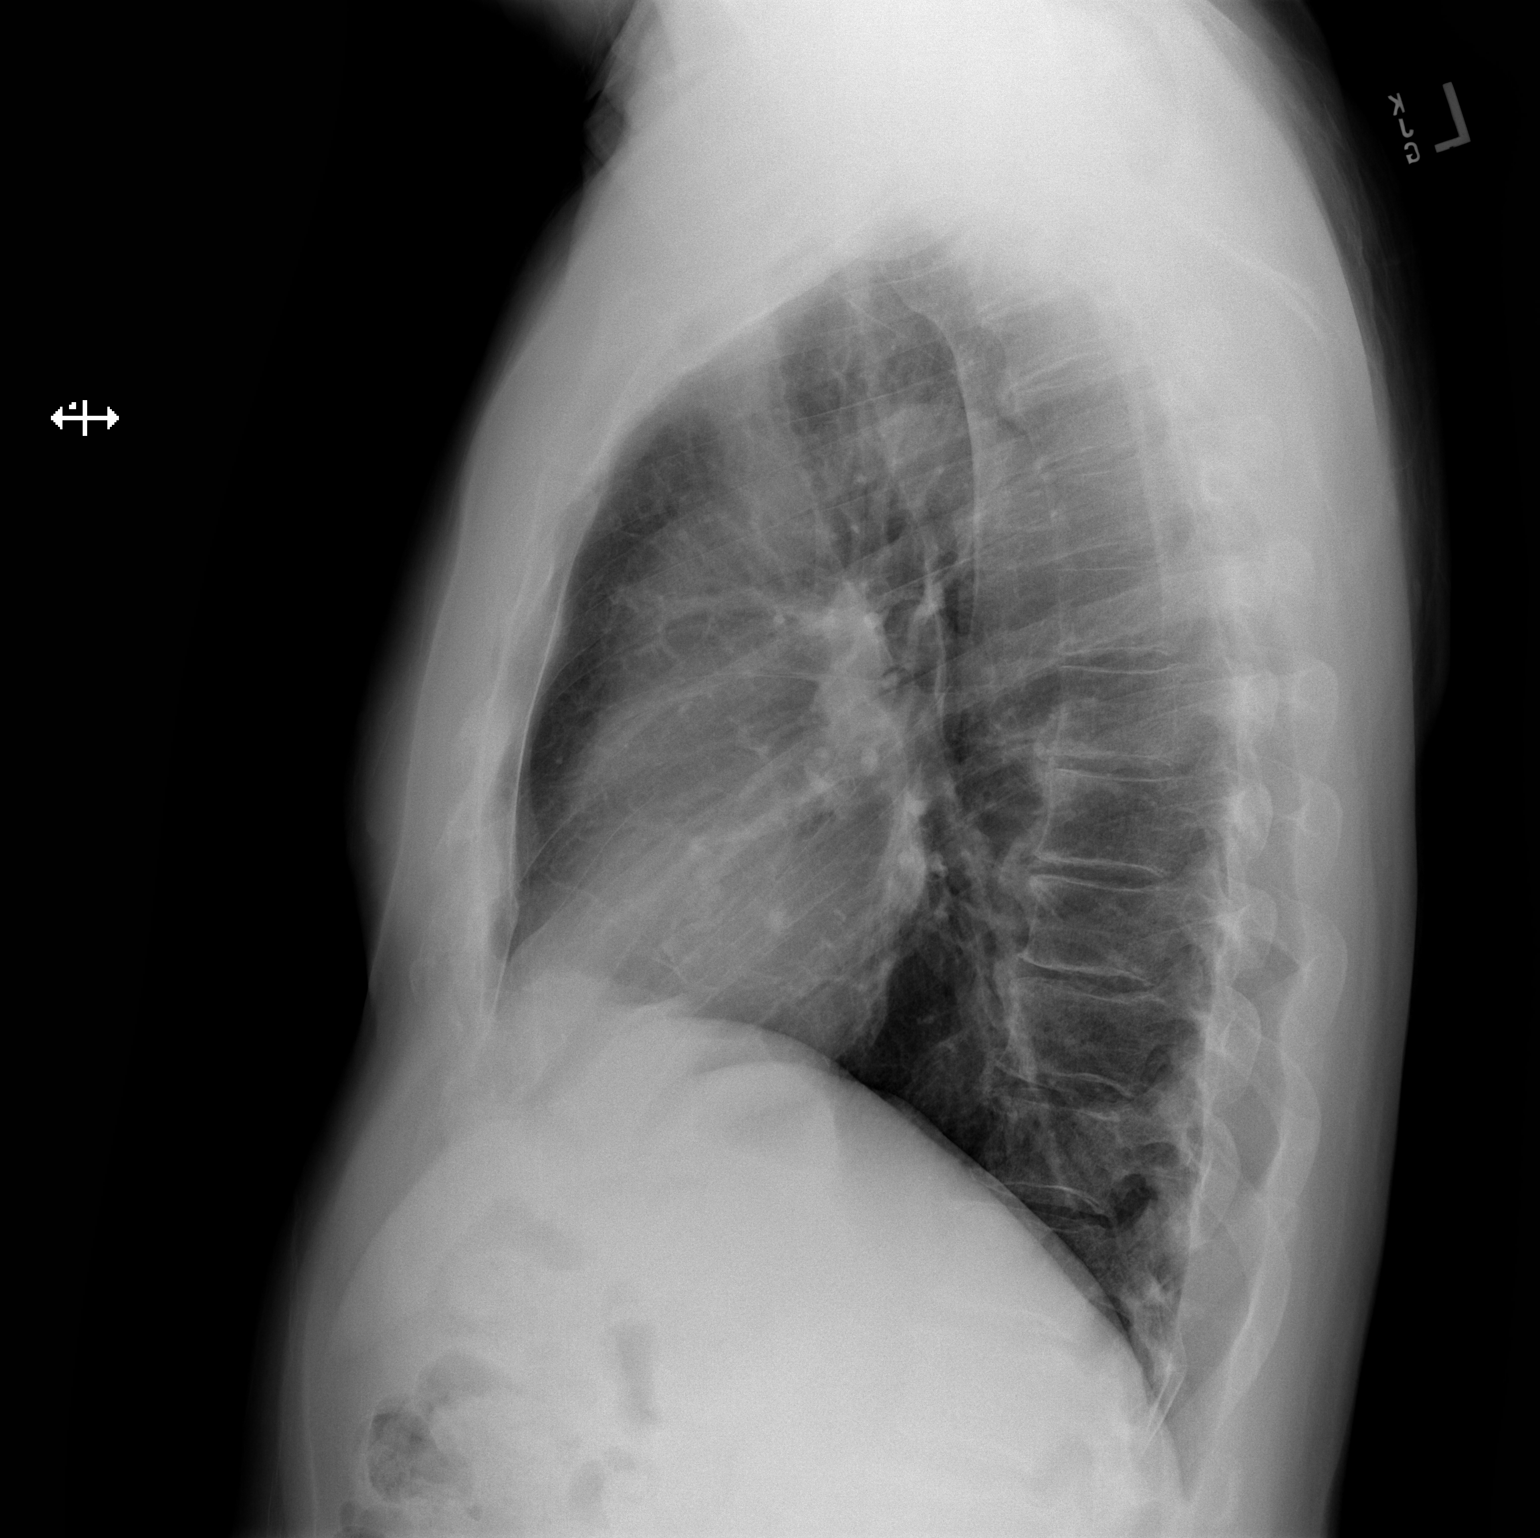

[2 of 2 positions shown; findings below may reference images not displayed]

FINDINGS: The heart size and mediastinal contours are within normal limits.
Both lungs are clear. The visualized skeletal structures are
unremarkable.
IMPRESSION: No active cardiopulmonary disease.

## 2020-06-21 IMAGING — DX PORTABLE CHEST - 1 VIEW
1 series · 1 of 1 positions shown · non-contrast
Comparison: None.

CLINICAL DATA: S/P TAVR (transcatheter aortic valve replacement)

EXAM:
PORTABLE CHEST 1 VIEW

[chest ap]
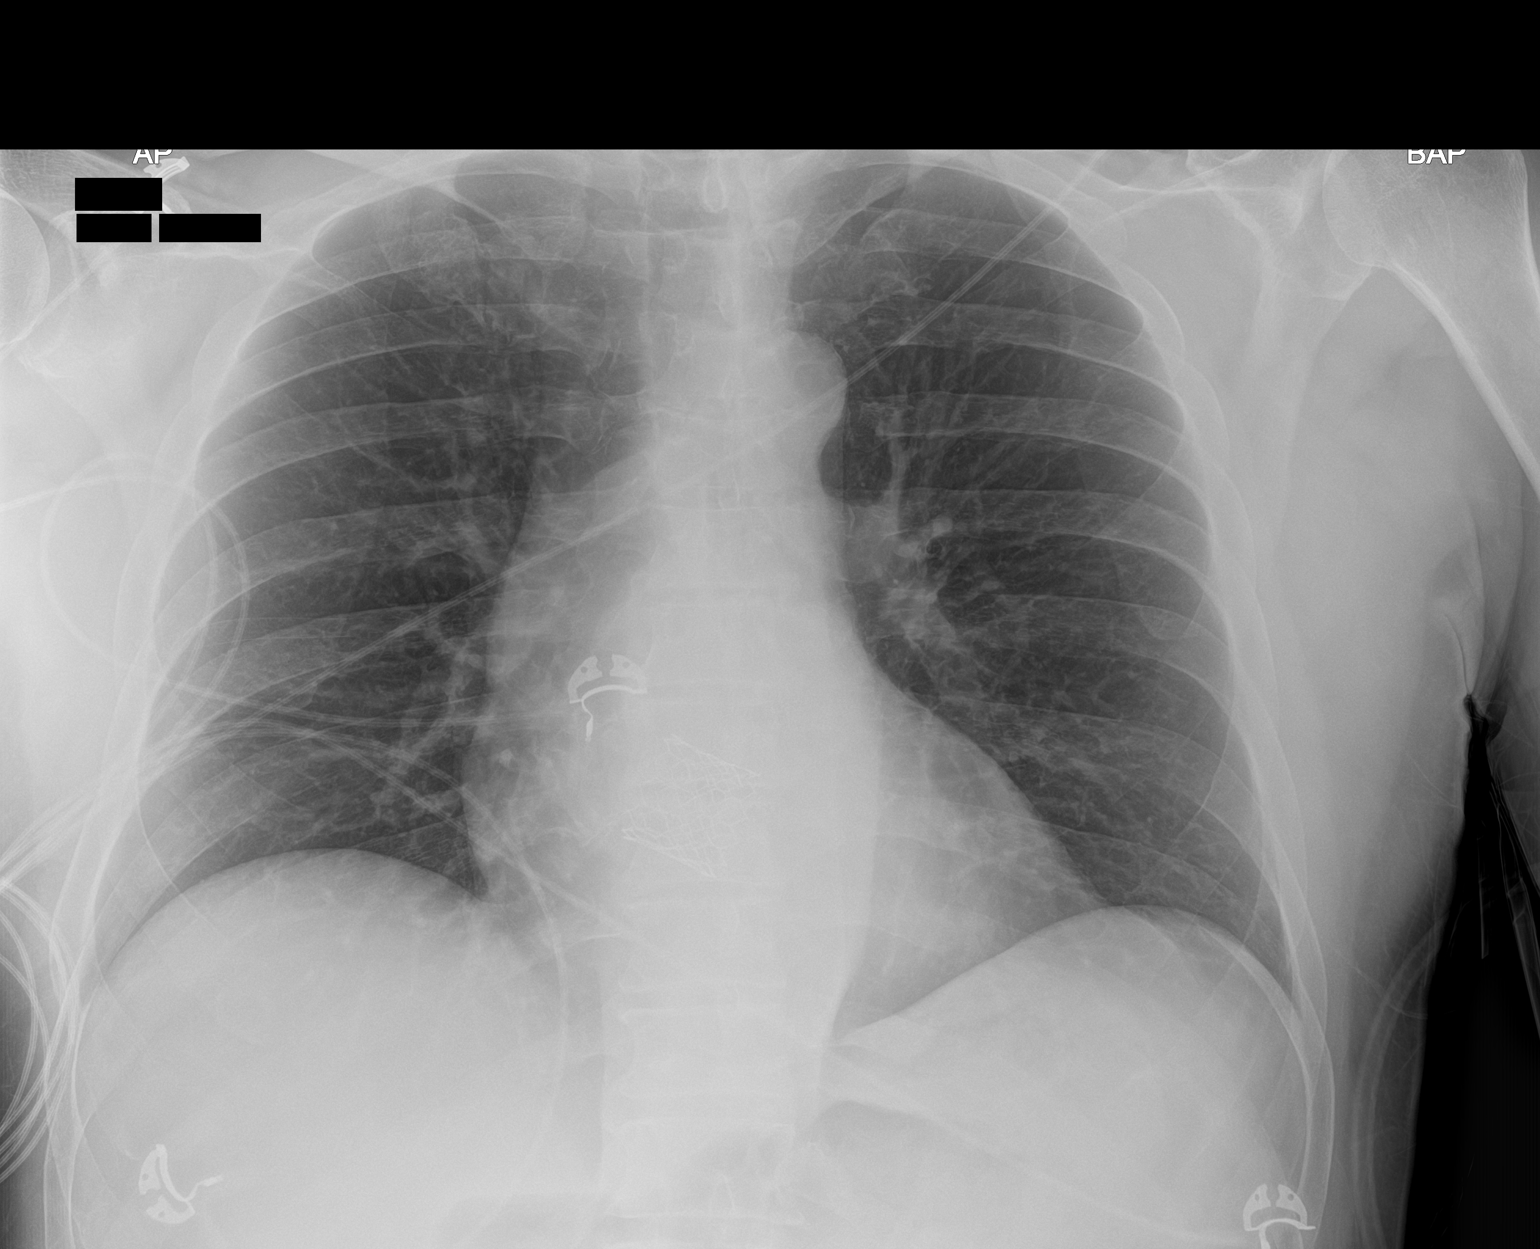

[1 of 1 positions shown; findings below may reference images not displayed]

FINDINGS: Heart size and mediastinal contours are stable. Aortic valve
replacement hardware appears appropriately positioned. Lungs are
clear. No pleural effusion or pneumothorax seen. Osseous structures
about the chest are unremarkable.
IMPRESSION: Aortic valve replacement hardware in place. Lungs are clear. No
evidence of procedural complicating feature.

## 2020-08-24 ENCOUNTER — Other Ambulatory Visit: Payer: Self-pay

## 2020-08-24 ENCOUNTER — Ambulatory Visit (HOSPITAL_COMMUNITY): Payer: Medicare Other | Attending: Cardiovascular Disease

## 2020-08-24 DIAGNOSIS — Z952 Presence of prosthetic heart valve: Secondary | ICD-10-CM | POA: Diagnosis not present

## 2020-08-24 LAB — ECHOCARDIOGRAM COMPLETE
AR max vel: 1.85 cm2
AV Area VTI: 2.08 cm2
AV Area mean vel: 2.05 cm2
AV Mean grad: 8.7 mmHg
AV Peak grad: 18.1 mmHg
Ao pk vel: 2.13 m/s
Area-P 1/2: 2.8 cm2
S' Lateral: 2.6 cm

## 2020-08-25 ENCOUNTER — Ambulatory Visit (INDEPENDENT_AMBULATORY_CARE_PROVIDER_SITE_OTHER): Payer: Medicare Other | Admitting: Cardiovascular Disease

## 2020-08-25 ENCOUNTER — Encounter: Payer: Self-pay | Admitting: Cardiovascular Disease

## 2020-08-25 VITALS — BP 130/78 | HR 69 | Ht 70.0 in | Wt 192.8 lb

## 2020-08-25 DIAGNOSIS — E782 Mixed hyperlipidemia: Secondary | ICD-10-CM

## 2020-08-25 DIAGNOSIS — I712 Thoracic aortic aneurysm, without rupture, unspecified: Secondary | ICD-10-CM

## 2020-08-25 DIAGNOSIS — I10 Essential (primary) hypertension: Secondary | ICD-10-CM

## 2020-08-25 DIAGNOSIS — I350 Nonrheumatic aortic (valve) stenosis: Secondary | ICD-10-CM

## 2020-08-25 NOTE — Assessment & Plan Note (Signed)
History of essential hypertension a blood pressure measured today 130/78.  He is on lisinopril.

## 2020-08-25 NOTE — Patient Instructions (Addendum)
Medication Instructions:  No Changes In Medications at this time.  *If you need a refill on your cardiac medications before your next appointment, please call your pharmacy*  Lab Work: BMET- 2 [place]  If you have labs (blood work) drawn today and your tests are completely normal, you will receive your results only by: [place] (if you have MyChart) OR A paper copy in the mail If you have any lab test that is abnormal or we need to change your treatment, we will call you to review the results.  Testing/Procedures: Your physician has requested that you have an echocardiogram IN ONE YEAR. Echocardiography is a painless test that uses sound waves to create images of your heart. It provides your doctor with information about the size and shape of your heart and how well your heart's chambers and valves are working. You may receive an ultrasound enhancing agent through an IV if needed to better visualize your heart during the echo.This procedure takes approximately one hour. There are no restrictions for this procedure. This will take place at the [address]   CTA AORTA- THIS IS ORDERED FOR October- SOMEONE WILL REACH OUT TO YOU TO SCHEDULE THIS.   Follow-Up: At [facility], you and your health needs are our priority.  As part of our continuing mission to provide you with exceptional heart care, we have created designated Provider Care Teams.  These Care Teams include your primary Cardiologist (physician) and Advanced Practice Providers (APPs -  Physician Assistants and Nurse Practitioners) who all work together to provide you with the care you need, when you need it.  Your next appointment:   1 year(s) NEEDS TO BE ONE DAY AFTER ECHO  The format for your next appointment:   In Person  Provider:   Quay Burow, MD

## 2020-08-25 NOTE — Assessment & Plan Note (Signed)
History of bicuspid aortic stenosis which had been progressive with a valve area of 0.82 cm.  I performed right and left heart cath on him 07/20/2018 revealing normal coronary arteries and severe AAS.  He ultimately underwent uncomplicated transfemoral TAVR by Drs. Cooper and Bartle 08/04/2018 with Oletta Lamas SAPIEN 3 THV  29 mm bioprosthetic valve.  Recent 2D echocardiogram performed 08/24/2020 revealed normal LV systolic function with a well-functioning aortic bioprosthesis.  This will be repeated on an annual basis.

## 2020-08-25 NOTE — Assessment & Plan Note (Signed)
History of hyperlipidemia on statin therapy with lipid profile performed by his PCP 05/03/2020 revealing total cholesterol of 166, LDL of 92 and HDL 52.

## 2020-08-25 NOTE — Progress Notes (Signed)
08/25/2020 Sean Abbott   Jul 24, 1945  [id number]  Primary Physician Default, Provider, MD Primary Cardiologist: Lorretta Harp MD Sean Abbott, [place], [place]  HPI:  Sean Abbott is a 75 y.o.  thin and fit-appearing, married Caucasian male, father of 61, grandfather to 28 grandchildren who I last saw for a virtual telemedicine video visit 07/07/2018.  He has a history of normal coronary arteries by cath which I performed December 05, 2005. He did have a generous thoracic aorta at that time and a CT showed his thoracic aorta measuring 4 cm as well as mild to moderate aortic stenosis. His other problems include mild hyperlipidemia in Crestor. He denies chest pain or shortness of breath but does complain of just some generalized fatigue, though he does exercise several times a week. 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  He had an echo performed 10/19/2013 revealing normal LV function, valve area 1.2 cm with a peak aortic gradient of 54 mmHg.   He moved down to [facility] to be closer to his mom who had Alzheimer's.  She apparently passed away 2 years ago.  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  He notices that on the back 9 he has increasing dyspnea on exertion over the last couple years which is more noticeable.  He did see a cardiologist down there and had an echo performed 02/26/2018 revealing normal LV function with a peak gradient of 84 mmHg and a valve area 0.82 cm.  I performed right and left heart cath on him 07/20/2018 revealing normal coronary arteries and severe aortic stenosis.  He ultimately underwent successful TAVR transfemoral he by Drs. Caffie Pinto and Burt Knack 08/04/2018 with an Edwards SAPIEN 3 TH V 29  mm bioprosthetic valve.  This resulted in considerable clinical improvement.  He is followed by Dr. Mitchel Honour in [facility].  He comes up here annually for his regular office visit and 2D echo which was performed 08/24/2020 revealing normal LV systolic function and a well-functioning aortic bioprosthesis.   Current Meds  Medication Sig   amoxicillin (AMOXIL) 500 MG tablet Take 4 tablets (2,000 mg total) by mouth as directed. 1 hour before dental work, including cleanings   aspirin EC 81 MG tablet Take 81 mg by mouth every evening.    CRESTOR 10 MG tablet TAKE 1 TABLET BY MOUTH EVERY OTHER DAY   Glucosamine-Chondroitin (COSAMIN DS PO) Take 1 tablet by mouth daily.   lisinopril (ZESTRIL) 20 MG tablet Take 20 mg by mouth daily.   Multiple Vitamin (MULTIVITAMIN WITH MINERALS) TABS tablet Take 1 tablet by mouth daily.     Allergies  Allergen Reactions   Lipitor [Atorvastatin] Other (See Comments)    Myalgias   Demerol [Meperidine] Other (See Comments)    HOT/AGITATED    Social History   Socioeconomic History   Marital status: Married    Spouse name: Not on file   Number of children: Not on file   Years of education: Not on file   Highest education level: Not on file  Occupational History   Not on file  Tobacco Use   Smoking status: Never   Smokeless tobacco: Never  Vaping Use   Vaping Use:  Never used  Substance and Sexual Activity   Alcohol use: Yes    Alcohol/week: 1.0 standard drink    Types: 1 Glasses of wine per week    Comment: every night   Drug use: Never   Sexual activity: Not on file  Other Topics Concern   Not on file  Social History Narrative   Not on file   Social Determinants of Health   Financial Resource Strain: Not on file  Food Insecurity: Not on file  Transportation Needs: Not on file  Physical Activity: Not on file  Stress: Not on file  Social Connections: Not on file  Intimate Partner Violence: Not on file     Review of Systems: General:  negative for chills, fever, night sweats or weight changes.  Cardiovascular: negative for chest pain, dyspnea on exertion, edema, orthopnea, palpitations, paroxysmal nocturnal dyspnea or shortness of breath Dermatological: negative for rash Respiratory: negative for cough or wheezing Urologic: negative for hematuria Abdominal: negative for nausea, vomiting, diarrhea, bright red blood per rectum, melena, or hematemesis Neurologic: negative for visual changes, syncope, or dizziness All other systems reviewed and are otherwise negative except as noted above.    Blood pressure 130/78, pulse 69, height 5\' 10"  (1.778 m), weight 192 lb 12.8 oz (87.5 kg), SpO2 98 %.  General appearance: alert and no distress Neck: no adenopathy, no carotid bruit, no JVD, supple, symmetrical, trachea midline, and thyroid not enlarged, symmetric, no tenderness/mass/nodules Lungs: clear to auscultation bilaterally Heart: regular rate and rhythm, S1, S2 normal, no murmur, click, rub or gallop Extremities: extremities normal, atraumatic, no cyanosis or edema Pulses: 2+ and symmetric Skin: Skin color, texture, turgor normal. No rashes or lesions Neurologic: Grossly normal  EKG sinus rhythm at 69 with nonspecific ST and T wave changes.  I personally reviewed this EKG.  ASSESSMENT AND PLAN:   Thoracic aortic aneurysm (HCC) History of mild dilatation of the ascending thoracic aorta measuring 41 mm by CTA 2 years ago.  We will have him recheck this down in [place].  Severe aortic stenosis History of bicuspid aortic stenosis which had been progressive with a valve area of 0.82 cm.  I performed right and left heart cath on him 07/20/2018 revealing normal coronary arteries and severe AAS.  He ultimately underwent uncomplicated transfemoral TAVR by Drs. Cooper and Bartle 08/04/2018 with Oletta Lamas SAPIEN 3 THV  29 mm bioprosthetic valve.  Recent 2D echocardiogram performed 08/24/2020 revealed normal LV systolic function  with a well-functioning aortic bioprosthesis.  This will be repeated on an annual basis.  Hyperlipidemia History of hyperlipidemia on statin therapy with lipid profile performed by his PCP 05/03/2020 revealing total cholesterol of 166, LDL of 92 and HDL 52.  Hypertension History of essential hypertension a blood pressure measured today 130/78.  He is on lisinopril.     Lorretta Harp MD FACP,FACC,FAHA, [facility] 08/25/2020 8:53 AM

## 2020-08-25 NOTE — Assessment & Plan Note (Signed)
History of mild dilatation of the ascending thoracic aorta measuring 41 mm by CTA 2 years ago.  We will have him recheck this down in [place].

## 2020-10-10 ENCOUNTER — Ambulatory Visit (HOSPITAL_COMMUNITY)
Admission: RE | Admit: 2020-10-10 | Discharge: 2020-10-10 | Disposition: A | Discharge status: Home / Self Care | Payer: Medicare Other | Source: Ambulatory Visit | Attending: Cardiovascular Disease | Admitting: Cardiovascular Disease

## 2020-10-10 ENCOUNTER — Other Ambulatory Visit: Payer: Self-pay

## 2020-10-10 ENCOUNTER — Encounter (HOSPITAL_COMMUNITY): Payer: Self-pay

## 2020-10-10 DIAGNOSIS — I712 Thoracic aortic aneurysm, without rupture, unspecified: Secondary | ICD-10-CM

## 2020-10-10 LAB — POCT I-STAT CREATININE: Creatinine, Ser: 1.1 mg/dL (ref 0.61–1.24)

## 2020-10-10 MED ORDER — IOHEXOL 350 MG/ML SOLN
100.0000 mL | Freq: Once | INTRAVENOUS | Status: AC | PRN
  Administered 2020-10-10: 100 mL via INTRAVENOUS

## 2020-10-12 ENCOUNTER — Other Ambulatory Visit: Payer: Self-pay | Admitting: *Deleted

## 2020-10-12 DIAGNOSIS — I712 Thoracic aortic aneurysm, without rupture, unspecified: Secondary | ICD-10-CM

## 2021-08-06 ENCOUNTER — Encounter: Payer: Self-pay | Admitting: Cardiovascular Disease

## 2021-08-06 DIAGNOSIS — I712 Thoracic aortic aneurysm, without rupture, unspecified: Secondary | ICD-10-CM

## 2021-08-24 ENCOUNTER — Ambulatory Visit (HOSPITAL_COMMUNITY): Payer: Medicare Other | Attending: Cardiology

## 2021-08-24 DIAGNOSIS — I350 Nonrheumatic aortic (valve) stenosis: Secondary | ICD-10-CM | POA: Diagnosis present

## 2021-08-24 DIAGNOSIS — I712 Thoracic aortic aneurysm, without rupture, unspecified: Secondary | ICD-10-CM | POA: Diagnosis present

## 2021-08-24 DIAGNOSIS — I10 Essential (primary) hypertension: Secondary | ICD-10-CM | POA: Insufficient documentation

## 2021-08-24 LAB — ECHOCARDIOGRAM COMPLETE
AR max vel: 2.92 cm2
AV Area VTI: 2.9 cm2
AV Area mean vel: 2.78 cm2
AV Mean grad: 5 mmHg
AV Peak grad: 8.6 mmHg
Ao pk vel: 1.47 m/s
Area-P 1/2: 3.93 cm2
P 1/2 time: 404 msec
S' Lateral: 2.9 cm

## 2021-08-24 MED ORDER — PERFLUTREN LIPID MICROSPHERE
1.0000 mL | INTRAVENOUS | Status: AC | PRN
  Administered 2021-08-24: 1 mL via INTRAVENOUS

## 2022-06-24 ENCOUNTER — Encounter: Payer: Self-pay | Admitting: Cardiovascular Disease

## 2022-06-24 ENCOUNTER — Telehealth: Payer: Self-pay | Admitting: Cardiovascular Disease

## 2022-06-24 DIAGNOSIS — I350 Nonrheumatic aortic (valve) stenosis: Secondary | ICD-10-CM

## 2022-06-24 DIAGNOSIS — I712 Thoracic aortic aneurysm, without rupture, unspecified: Secondary | ICD-10-CM

## 2022-06-24 DIAGNOSIS — E782 Mixed hyperlipidemia: Secondary | ICD-10-CM

## 2022-06-24 NOTE — Telephone Encounter (Signed)
Patient last visit 2022.  Does he need labs or any testing including echo, prior to his 08/21/22 appt.?

## 2022-06-24 NOTE — Telephone Encounter (Signed)
  Spoke with patient today to schedule his overdue follow up with Dr Allyson Sabal. Patient asked if he needed to have an Echo stating he usually does one every year. If patient needs Echo, can orders be placed for scheduling?

## 2022-06-25 NOTE — Telephone Encounter (Signed)
He needs a 2D echo and a FLP prior to a ROV with me  Allyson Sabal, MD

## 2022-06-25 NOTE — Telephone Encounter (Signed)
Called left message to inform patient  he needs an Echo and fasting lipid panel  Order has been placed for both.  Any question may call back

## 2022-06-26 ENCOUNTER — Encounter: Payer: Self-pay | Admitting: Cardiovascular Disease

## 2022-07-03 ENCOUNTER — Encounter: Payer: Self-pay | Admitting: Cardiovascular Disease

## 2022-08-16 ENCOUNTER — Other Ambulatory Visit (HOSPITAL_COMMUNITY): Payer: Medicare Other

## 2022-08-19 ENCOUNTER — Ambulatory Visit (HOSPITAL_COMMUNITY): Payer: Medicare Other | Attending: Cardiovascular Disease

## 2022-08-19 DIAGNOSIS — I712 Thoracic aortic aneurysm, without rupture, unspecified: Secondary | ICD-10-CM | POA: Diagnosis not present

## 2022-08-19 DIAGNOSIS — I350 Nonrheumatic aortic (valve) stenosis: Secondary | ICD-10-CM | POA: Insufficient documentation

## 2022-08-19 LAB — ECHOCARDIOGRAM COMPLETE
AR max vel: 2.34 cm2
AV Area VTI: 2.48 cm2
AV Area mean vel: 2.3 cm2
AV Mean grad: 7.7 mmHg
AV Peak grad: 13.3 mmHg
Ao pk vel: 1.82 m/s
Area-P 1/2: 2.76 cm2
S' Lateral: 3 cm

## 2022-08-21 ENCOUNTER — Ambulatory Visit: Payer: Medicare Other | Attending: Cardiovascular Disease | Admitting: Cardiovascular Disease

## 2022-08-21 ENCOUNTER — Encounter: Payer: Self-pay | Admitting: Cardiovascular Disease

## 2022-08-21 VITALS — BP 116/74 | HR 66 | Ht 70.0 in | Wt 194.8 lb

## 2022-08-21 DIAGNOSIS — I712 Thoracic aortic aneurysm, without rupture, unspecified: Secondary | ICD-10-CM

## 2022-08-21 DIAGNOSIS — E782 Mixed hyperlipidemia: Secondary | ICD-10-CM | POA: Diagnosis present

## 2022-08-21 DIAGNOSIS — I350 Nonrheumatic aortic (valve) stenosis: Secondary | ICD-10-CM | POA: Diagnosis present

## 2022-08-21 DIAGNOSIS — I10 Essential (primary) hypertension: Secondary | ICD-10-CM

## 2022-08-21 NOTE — Patient Instructions (Addendum)
Medication Instructions:  Your physician recommends that you continue on your current medications as directed. Please refer to the Current Medication list given to you today.  *If you need a refill on your cardiac medications before your next appointment, please call your pharmacy*   Testing/Procedures: Your physician has requested that you have an echocardiogram. Echocardiography is a painless test that uses sound waves to create images of your heart. It provides your doctor with information about the size and shape of your heart and how well your heart's chambers and valves are working. This procedure takes approximately one hour. There are no restrictions for this procedure. Please do NOT wear cologne, perfume, aftershave, or lotions (deodorant is allowed). Please arrive 15 minutes prior to your appointment time. This will take place at [address] **To do in July 2025**    Follow-Up: At [facility], you and your health needs are our priority.  As part of our continuing mission to provide you with exceptional heart care, we have created designated Provider Care Teams.  These Care Teams include your primary Cardiologist (physician) and Advanced Practice Providers (APPs -  Physician Assistants and Nurse Practitioners) who all work together to provide you with the care you need, when you need it.  We recommend signing up for the patient portal called "MyChart".  Sign up information is provided on this After Visit Summary.  MyChart is used to connect with patients for Virtual Visits (Telemedicine).  Patients are able to view lab/test results, encounter notes, upcoming appointments, etc.  Non-urgent messages can be sent to your provider as well.   To learn more about what you can do with MyChart, go to [email or web address].    Your next appointment:   12 month(s)  Provider:   Nanetta Batty, MD     Other Instructions Please let us know if cramps resolve with  statin holiday.

## 2022-08-21 NOTE — Assessment & Plan Note (Signed)
History of severe aortic stenosis status post TAVR by Drs. Bartle and Excell Seltzer 08/04/2018 after right left heart cath performed by myself 07/20/2018 revealed normal coronary arteries.  He had an Edwards SAPIEN 3 THV 29 mm bioprosthetic valve.  He has no symptoms.  Echo performed on 08/19/2022 revealed a well-functioning aortic bioprosthesis.  This will be reevaluated on annual basis.

## 2022-08-21 NOTE — Assessment & Plan Note (Signed)
History of hyperlipidemia on Crestor 10 mg every other day with lipid profile performed 3 months ago revealing a total cholesterol 166, LDL 87 and HDL 64, acceptable for primary prevention given his normal coronary arteries.  He says has been having some nocturnal leg cramps I was wondering whether this was statin related.  I told him to take a statin holiday for the next 4 to 6 weeks and reassess.

## 2022-08-21 NOTE — Progress Notes (Signed)
08/21/2022 Sean Abbott   15-Dec-1945  [id number]  Primary Physician Default, Provider, MD Primary Cardiologist: Runell Gess MD FACP, [place], [place], [place]  HPI:  Sean Abbott is a 77 y.o.    thin and fit-appearing, married Caucasian male, father of 5, grandfather to 20 grandchildren who I last saw in the office 08/25/2020.  He is accompanied by his wife Roderic Scarce today.  They are up from [facility] where they reside.  He has a history of normal coronary arteries by cath which I performed December 05, 2005. He did have a generous thoracic aorta at that time and a CT showed his thoracic aorta measuring 4 cm as well as mild to moderate aortic stenosis. His other problems include mild hyperlipidemia in Crestor. He denies chest pain or shortness of breath but does complain of just some generalized fatigue, though he does exercise several times a week. 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  He had an echo performed 10/19/2013 revealing normal LV function, valve area 1.2 cm with a peak aortic gradient of 54 mmHg.   He moved down to [facility] to be closer to his mom who had Alzheimer's.  She apparently passed away 2 years ago.  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  He notices that on the back 9 he has increasing dyspnea on exertion over the last couple years which is more noticeable.  He did see a cardiologist down there and had an echo performed 02/26/2018 revealing normal LV function with a peak gradient of 84 mmHg and a valve area 0.82 cm.  I performed right and left heart cath on him 07/20/2018 revealing normal coronary arteries and severe aortic stenosis.  He ultimately underwent successful TAVR  transfemoral he by Drs. Lavinia Sharps and Excell Seltzer 08/04/2018 with an Edwards SAPIEN 3 TH V 29 mm bioprosthetic valve.  This resulted in considerable clinical improvement.  He is followed by Dr. Horatio Pel in [facility].  He comes up here annually for his regular office visit and 2D echo which was performed 08/19/2022 revealed normal LV function, normal aortic bioprosthesis with a ascending thoracic aorta measuring 42 mm.  Since I saw him 2 years ago he is doing well.  He is very active and plays golf 3 to 5 days a week depending on the season.  He does upper body strength exercises and walks regularly.  He is completely asymptomatic.  He does complain of some cramping in his legs at night which he wonders whether is related to his statin drug.  Current Meds  Medication Sig   amoxicillin (AMOXIL) 500 MG tablet Take 4 tablets (2,000 mg total) by mouth as directed. 1 hour before dental work, including cleanings   aspirin EC 81 MG tablet Take 81 mg by mouth every evening.    CRESTOR 10 MG tablet TAKE 1 TABLET BY MOUTH EVERY OTHER DAY   lisinopril (ZESTRIL) 20 MG tablet Take 20 mg by mouth daily.   Multiple Vitamin (MULTIVITAMIN WITH MINERALS) TABS tablet Take 1 tablet by mouth daily.     Allergies  Allergen Reactions   Lipitor [Atorvastatin] Other (See Comments)    Myalgias   Demerol [Meperidine] Other (See Comments)  HOT/AGITATED    Social History   Socioeconomic History   Marital status: Married    Spouse name: Not on file   Number of children: Not on file   Years of education: Not on file   Highest education level: Not on file  Occupational History   Not on file  Tobacco Use   Smoking status: Never   Smokeless tobacco: Never  Vaping Use   Vaping Use: Never used  Substance and Sexual Activity   Alcohol use: Yes    Alcohol/week: 1.0 standard drink of alcohol    Types: 1 Glasses of wine per week    Comment: every night   Drug use: Never   Sexual activity: Not on file  Other Topics  Concern   Not on file  Social History Narrative   Not on file   Social Determinants of Health   Financial Resource Strain: Not on file  Food Insecurity: Not on file  Transportation Needs: Not on file  Physical Activity: Not on file  Stress: Not on file  Social Connections: Not on file  Intimate Partner Violence: Not on file     Review of Systems: General: negative for chills, fever, night sweats or weight changes.  Cardiovascular: negative for chest pain, dyspnea on exertion, edema, orthopnea, palpitations, paroxysmal nocturnal dyspnea or shortness of breath Dermatological: negative for rash Respiratory: negative for cough or wheezing Urologic: negative for hematuria Abdominal: negative for nausea, vomiting, diarrhea, bright red blood per rectum, melena, or hematemesis Neurologic: negative for visual changes, syncope, or dizziness All other systems reviewed and are otherwise negative except as noted above.    Blood pressure 116/74, pulse 66, height 5\' 10"  (1.778 m), weight 194 lb 12.8 oz (88.4 kg), SpO2 97 %.  General appearance: alert and no distress Neck: no adenopathy, no carotid bruit, no JVD, supple, symmetrical, trachea midline, and thyroid not enlarged, symmetric, no tenderness/mass/nodules Lungs: clear to auscultation bilaterally Heart: regular rate and rhythm, S1, S2 normal, no murmur, click, rub or gallop Extremities: extremities normal, atraumatic, no cyanosis or edema Pulses: 2+ and symmetric Skin: Skin color, texture, turgor normal. No rashes or lesions Neurologic: Grossly normal  EKG EKG Interpretation Date/Time:  Wednesday August 21 2022 12:55:50 EDT Ventricular Rate:  66 PR Interval:  180 QRS Duration:  112 QT Interval:  392 QTC Calculation: 410 R Axis:   -20  Text Interpretation: Normal sinus rhythm Incomplete right bundle branch block When compared with ECG of 05-Aug-2018 04:44, Incomplete right bundle branch block has replaced Non-specific  intra-ventricular conduction delay Confirmed by Nanetta Batty [phone]) on 08/21/2022 1:01:08 PM    ASSESSMENT AND PLAN:   Thoracic aortic aneurysm (HCC) History of small ascending thoracic aortic aneurysm measuring 42 mm by echo performed 08/19/2022 which has remained stable for the last several years.  Will continue to follow this noninvasively on annual basis.  Severe aortic stenosis History of severe aortic stenosis status post TAVR by Drs. Bartle and Excell Seltzer 08/04/2018 after right left heart cath performed by myself 07/20/2018 revealed normal coronary arteries.  He had an Edwards SAPIEN 3 THV 29 mm bioprosthetic valve.  He has no symptoms.  Echo performed on 08/19/2022 revealed a well-functioning aortic bioprosthesis.  This will be reevaluated on annual basis.  Hyperlipidemia History of hyperlipidemia on Crestor 10 mg every other day with lipid profile performed 3 months ago revealing a total cholesterol 166, LDL 87 and HDL 64, acceptable for primary prevention given his normal coronary arteries.  He says has been having  some nocturnal leg cramps I was wondering whether this was statin related.  I told him to take a statin holiday for the next 4 to 6 weeks and reassess.  Hypertension History of essential hypertension blood pressure measured at 116/74.  He is on lisinopril.     Runell Gess MD FACP,FACC,FAHA, FSCAI 08/21/2022 1:20 PM

## 2022-08-21 NOTE — Assessment & Plan Note (Signed)
History of small ascending thoracic aortic aneurysm measuring 42 mm by echo performed 08/19/2022 which has remained stable for the last several years.  Will continue to follow this noninvasively on annual basis.

## 2022-08-21 NOTE — Assessment & Plan Note (Signed)
History of essential hypertension blood pressure measured at 116/74.  He is on lisinopril.

## 2022-09-02 ENCOUNTER — Encounter: Payer: Self-pay | Admitting: Cardiovascular Disease

## 2022-09-02 ENCOUNTER — Other Ambulatory Visit: Payer: Self-pay

## 2022-09-02 MED ORDER — ROSUVASTATIN CALCIUM 10 MG PO TABS: 10.0000 mg | ORAL_TABLET | ORAL | 3 refills | Status: AC

## 2023-07-10 ENCOUNTER — Encounter: Payer: Self-pay | Admitting: Cardiovascular Disease

## 2023-07-10 DIAGNOSIS — I712 Thoracic aortic aneurysm, without rupture, unspecified: Secondary | ICD-10-CM

## 2023-07-10 DIAGNOSIS — I350 Nonrheumatic aortic (valve) stenosis: Secondary | ICD-10-CM

## 2023-07-10 DIAGNOSIS — I10 Essential (primary) hypertension: Secondary | ICD-10-CM

## 2023-07-10 DIAGNOSIS — E782 Mixed hyperlipidemia: Secondary | ICD-10-CM

## 2023-07-11 NOTE — Telephone Encounter (Signed)
 Spoke with Sean Abbott regarding follow up office visit after echo. Sean Abbott scheduled to see Dr. Katheryne Pane on 7/9. Sean Abbott is asking for echo to be scheduled on 7/8. Sean Abbott has no further questions at this time.

## 2023-08-26 ENCOUNTER — Other Ambulatory Visit (HOSPITAL_COMMUNITY)

## 2023-08-27 ENCOUNTER — Ambulatory Visit: Admitting: Cardiovascular Disease

## 2023-09-16 ENCOUNTER — Ambulatory Visit: Payer: Self-pay | Admitting: Cardiovascular Disease

## 2023-09-16 ENCOUNTER — Ambulatory Visit (HOSPITAL_COMMUNITY)
Admission: RE | Admit: 2023-09-16 | Discharge: 2023-09-16 | Disposition: A | Discharge status: Home / Self Care | Source: Ambulatory Visit | Attending: Cardiology | Admitting: Cardiology

## 2023-09-16 DIAGNOSIS — I10 Essential (primary) hypertension: Secondary | ICD-10-CM | POA: Diagnosis not present

## 2023-09-16 DIAGNOSIS — I350 Nonrheumatic aortic (valve) stenosis: Secondary | ICD-10-CM | POA: Insufficient documentation

## 2023-09-16 DIAGNOSIS — I712 Thoracic aortic aneurysm, without rupture, unspecified: Secondary | ICD-10-CM | POA: Insufficient documentation

## 2023-09-16 DIAGNOSIS — E782 Mixed hyperlipidemia: Secondary | ICD-10-CM | POA: Insufficient documentation

## 2023-09-16 LAB — ECHOCARDIOGRAM COMPLETE
AV Mean grad: 6.8 mmHg
AV Peak grad: 12 mmHg
Ao pk vel: 1.73 m/s
Area-P 1/2: 3.88 cm2
S' Lateral: 3 cm

## 2023-09-17 ENCOUNTER — Ambulatory Visit: Attending: Cardiology | Admitting: Cardiovascular Disease

## 2023-09-17 ENCOUNTER — Encounter: Payer: Self-pay | Admitting: Cardiovascular Disease

## 2023-09-17 VITALS — BP 122/60 | HR 61 | Ht 70.0 in | Wt 197.0 lb

## 2023-09-17 DIAGNOSIS — I7121 Aneurysm of the ascending aorta, without rupture: Secondary | ICD-10-CM | POA: Insufficient documentation

## 2023-09-17 DIAGNOSIS — I350 Nonrheumatic aortic (valve) stenosis: Secondary | ICD-10-CM | POA: Insufficient documentation

## 2023-09-17 DIAGNOSIS — E782 Mixed hyperlipidemia: Secondary | ICD-10-CM | POA: Insufficient documentation

## 2023-09-17 DIAGNOSIS — I10 Essential (primary) hypertension: Secondary | ICD-10-CM | POA: Insufficient documentation

## 2023-09-17 NOTE — Progress Notes (Signed)
 09/17/2023 Sean Abbott   1945-07-23  [id number]  Primary Physician Default, Provider, MD Primary Cardiologist: Sean JINNY Lesches MD FACP, [place], [place], [place]  HPI:  Sean Abbott is a 78 y.o.   thin and fit-appearing, married Caucasian male, father of 5, grandfather to 70 grandchildren who I last saw in the office 08/21/2022.  He is accompanied by his wife Sean Abbott today.  They are up from [name] [place]  where they reside.  He has a history of normal coronary arteries by cath which I performed December 05, 2005. He did have a generous thoracic aorta at that time and a CT showed his thoracic aorta measuring 4 cm as well as mild to moderate aortic stenosis. His other problems include mild hyperlipidemia in [place] . He denies chest pain or shortness of breath but does complain of just some generalized fatigue, though he does exercise several times a week. 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  He had an echo performed 10/19/2013 revealing normal LV function, valve area 1.2 cm with a peak aortic gradient of 54 mmHg.   He moved down to [facility] [place]  to be closer to his mom who had Alzheimer's.  She apparently passed away 2 years ago.  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  He notices that on the back 9 he has increasing dyspnea on exertion over the last couple years which is more noticeable.  He did see a cardiologist down there and had an echo performed 02/26/2018 revealing normal LV function with a peak gradient of 84 mmHg and a valve area 0.82 cm.  I performed right and left heart cath on him 07/20/2018 revealing normal coronary arteries and severe aortic stenosis.  He ultimately underwent successful TAVR  transfemoral he by Drs. Sherrine and Wonda 08/04/2018 with an Edwards SAPIEN 3 TH V 29 mm bioprosthetic valve.  This resulted in considerable clinical improvement.  He is followed by Dr. Annamarie in [facility] [place] .  He comes up here annually for his regular office visit and 2D echo which was performed 08/19/2022 revealed normal LV function, normal aortic bioprosthesis with a ascending thoracic aorta measuring 42 mm.   Since I saw him in the office 1 year ago he is doing well.  He is very active and plays golf 3 to 5 days a week depending on the season.  He does upper body strength exercises and walks regularly.  He is completely asymptomatic.  His most recent echo performed yesterday revealed normal LV systolic function, mild concentric LVH with a well-functioning aortic bioprosthesis and an ascending aorta measuring 38 mm.     Current Meds  Medication Sig   amoxicillin  (AMOXIL ) 500 MG tablet Take 4 tablets (2,000 mg total) by mouth as directed. 1 hour before dental work, including cleanings   aspirin  EC 81 MG tablet Take 81 mg by mouth every evening.    lisinopril (ZESTRIL) 20 MG tablet Take 20 mg by mouth daily.   Multiple Vitamin (MULTIVITAMIN WITH MINERALS) TABS tablet Take 1 tablet by mouth daily.   rosuvastatin  (CRESTOR ) 10 MG tablet Take 1 tablet (10 mg total) by mouth every other day.     Allergies  Allergen Reactions   Lipitor [  Atorvastatin] Other (See Comments)    Myalgias   Demerol [Meperidine] Other (See Comments)    HOT/AGITATED    Social History   Socioeconomic History   Marital status: Married    Spouse name: Not on file   Number of children: Not on file   Years of education: Not on file   Highest education level: Not on file  Occupational History   Not on file  Tobacco Use   Smoking status: Never   Smokeless tobacco: Never  Vaping Use   Vaping status: Never Used  Substance and Sexual Activity   Alcohol use: Yes    Alcohol/week: 1.0 standard drink of alcohol     Types: 1 Glasses of wine per week    Comment: every night   Drug use: Never   Sexual activity: Not on file  Other Topics Concern   Not on file  Social History Narrative   Not on file   Social Drivers of Health   Financial Resource Strain: Low Risk  (05/14/2020)   Received from [facility]   Overall Financial Resource Strain (CARDIA)    Difficulty of Paying Living Expenses: Not hard at all  Food Insecurity: No Food Insecurity (05/14/2020)   Received from [facility]   Food Insecurity    Within the past 12 months, you worried that your food would run out before you got the money to buy more.: 1    Within the past 12 months, the food you bought just didn't last and you didn't have money to get more.: 1  Transportation Needs: No Transportation Needs (05/14/2020)   Received from [facility]   Transportation Needs    In the past 12 months, has lack of transportation kept you from medical appointments or from getting medications?: 2    In the past 12 months, has lack of transportation kept you from meetings, work, or from getting things needed for daily living?: 2  Physical Activity: Sufficiently Active (05/27/2022)   Received from [facility]   Physical Activity    On average, how many days per week do you engage in moderate to strenuous exercise (like a brisk walk)?: 6 days    On average, how many minutes do you engage in exercise at this level?: 110 min  Stress: No Stress Concern Present (05/14/2020)   Received from [facility]   [employer] of Occupational Health - Occupational Stress Questionnaire    Feeling of Stress : Not at all  Social Connections: Socially Integrated (05/14/2020)   Received from [facility]   Social Connection and Isolation Panel    In a typical week, how many times do you talk on the phone with family, friends, or neighbors?: Once a week    How often do you get together with friends or relatives?: Three times a week    How often do you attend church or  religious services?: 1 to 4 times per year    Do you belong to any clubs or organizations such as church groups, unions, fraternal or athletic groups, or school groups?: Yes    How often do you attend meetings of the clubs or organizations you belong to?: 1 to 4 times per year    Are you married, widowed, divorced, separated, never married, or living with a partner?: Married  Intimate Partner Violence: Not At Risk (05/14/2020)   Received from [facility]   Humiliation, Afraid, Rape, and Kick questionnaire    Within the last year, have you been afraid of your partner or ex-partner?: No  Within the last year, have you been humiliated or emotionally abused in other ways by your partner or ex-partner?: No    Within the last year, have you been kicked, hit, slapped, or otherwise physically hurt by your partner or ex-partner?: No    Within the last year, have you been raped or forced to have any kind of sexual activity by your partner or ex-partner?: No     Review of Systems: General: negative for chills, fever, night sweats or weight changes.  Cardiovascular: negative for chest pain, dyspnea on exertion, edema, orthopnea, palpitations, paroxysmal nocturnal dyspnea or shortness of breath Dermatological: negative for rash Respiratory: negative for cough or wheezing Urologic: negative for hematuria Abdominal: negative for nausea, vomiting, diarrhea, bright red blood per rectum, melena, or hematemesis Neurologic: negative for visual changes, syncope, or dizziness All other systems reviewed and are otherwise negative except as noted above.    Blood pressure 122/60, pulse 61, height 5' 10 (1.778 m), weight 197 lb (89.4 kg), SpO2 97%.  General appearance: alert and no distress Neck: no adenopathy, no carotid bruit, no JVD, supple, symmetrical, trachea midline, and thyroid not enlarged, symmetric, no tenderness/mass/nodules Lungs: clear to auscultation bilaterally Heart: regular rate and rhythm,  S1, S2 normal, no murmur, click, rub or gallop Extremities: extremities normal, atraumatic, no cyanosis or edema Pulses: 2+ and symmetric Skin: Skin color, texture, turgor normal. No rashes or lesions Neurologic: Grossly normal  EKG EKG Interpretation Date/Time:  Wednesday September 17 2023 08:19:45 EDT Ventricular Rate:  61 PR Interval:  198 QRS Duration:  112 QT Interval:  396 QTC Calculation: 398 R Axis:   -13  Text Interpretation: Normal sinus rhythm Incomplete right bundle branch block When compared with ECG of 21-Aug-2022 12:55, No significant change was found Confirmed by Court Carrier [phone]) on 09/17/2023 8:27:19 AM    ASSESSMENT AND PLAN:   Thoracic aortic aneurysm (HCC) History of small ascending thoracic aortic aneurysm measuring 38 mm by recent 2D echo 08/27/2023 although year ago it measured 45 mm.  Will continue to follow this annually by echo but have recommended that he get a chest CTA for alternative imaging reasons.  Severe aortic stenosis History of severe symptomatic aortic stenosis status post TAVR by Drs. Bartle and Wonda 08/04/2018 with an Edwards SAPIEN 3 THV 29 mm bioprosthetic valve.  His symptoms resolved.  He is very active.  His recent echo performed 09/16/2023 revealed normal LV systolic function with mild concentric LVH and a normally functioning aortic bioprosthesis.  Ascending aorta measured 38 mm.  This will be repeated in 1 year.  Hyperlipidemia History of hyperlipidemia on low-dose statin therapy with lipid profile performed 3 months ago revealing a total cholesterol 159, LDL of 85 and HDL of 59.  Hypertension History of essential hypertension with blood pressure measured today 122/60.  He is on lisinopril.     Carrier DOROTHA Court MD FACP,FACC,FAHA, [facility] 09/17/2023 8:46 AM

## 2023-09-17 NOTE — Patient Instructions (Signed)

## 2023-09-17 NOTE — Assessment & Plan Note (Signed)
 History of severe symptomatic aortic stenosis status post TAVR by Drs. Bartle and Wonda 08/04/2018 with an Edwards SAPIEN 3 THV 29 mm bioprosthetic valve.  His symptoms resolved.  He is very active.  His recent echo performed 09/16/2023 revealed normal LV systolic function with mild concentric LVH and a normally functioning aortic bioprosthesis.  Ascending aorta measured 38 mm.  This will be repeated in 1 year.

## 2023-09-17 NOTE — Assessment & Plan Note (Signed)
 History of small ascending thoracic aortic aneurysm measuring 38 mm by recent 2D echo 08/27/2023 although year ago it measured 45 mm.  Will continue to follow this annually by echo but have recommended that he get a chest CTA for alternative imaging reasons.

## 2023-09-17 NOTE — Assessment & Plan Note (Signed)
 History of essential hypertension with blood pressure measured today 122/60.  He is on lisinopril.

## 2023-09-17 NOTE — Assessment & Plan Note (Signed)
 History of hyperlipidemia on low-dose statin therapy with lipid profile performed 3 months ago revealing a total cholesterol 159, LDL of 85 and HDL of 59.

## 2023-10-07 ENCOUNTER — Encounter: Payer: Self-pay | Admitting: Cardiovascular Disease

## 2023-11-27 ENCOUNTER — Encounter: Payer: Self-pay | Admitting: Cardiovascular Disease
